# Patient Record
Sex: Female | Born: 1975 | Race: Black or African American | Hispanic: No | Marital: Single | Smoking: Former smoker
Health system: Southern US, Community
[De-identification: ages and names within clinical notes are randomized; demographics above are authoritative.]

## PROBLEM LIST (undated history)

## (undated) DIAGNOSIS — M25461 Effusion, right knee: Secondary | ICD-10-CM

## (undated) DIAGNOSIS — S83289A Other tear of lateral meniscus, current injury, unspecified knee, initial encounter: Secondary | ICD-10-CM

## (undated) DIAGNOSIS — N809 Endometriosis, unspecified: Secondary | ICD-10-CM

## (undated) HISTORY — PX: WISDOM TOOTH EXTRACTION: SHX21

## (undated) HISTORY — DX: Endometriosis, unspecified: N80.9

---

## 1999-02-11 ENCOUNTER — Emergency Department (HOSPITAL_COMMUNITY): Admission: EM | Admit: 1999-02-11 | Discharge: 1999-02-11 | Payer: Self-pay | Admitting: Emergency Medicine

## 2011-08-29 ENCOUNTER — Ambulatory Visit (INDEPENDENT_AMBULATORY_CARE_PROVIDER_SITE_OTHER): Payer: Managed Care, Other (non HMO) | Admitting: Family Medicine

## 2011-08-29 ENCOUNTER — Ambulatory Visit: Payer: Managed Care, Other (non HMO)

## 2011-08-29 VITALS — BP 116/82 | HR 68 | Temp 98.4°F | Resp 16 | Ht 63.0 in | Wt 223.0 lb

## 2011-08-29 DIAGNOSIS — M25561 Pain in right knee: Secondary | ICD-10-CM

## 2011-08-29 DIAGNOSIS — Z720 Tobacco use: Secondary | ICD-10-CM

## 2011-08-29 DIAGNOSIS — E669 Obesity, unspecified: Secondary | ICD-10-CM | POA: Insufficient documentation

## 2011-08-29 DIAGNOSIS — M25569 Pain in unspecified knee: Secondary | ICD-10-CM

## 2011-08-29 MED ORDER — MELOXICAM 7.5 MG PO TABS: 7.5000 mg | ORAL_TABLET | Freq: Every day | ORAL | Status: AC

## 2011-08-29 NOTE — Patient Instructions (Signed)
Knee Sprain  You have a knee sprain. Sprains are painful injuries to the joints. A sprain is a partial or complete tearing of ligaments. Ligaments are tough, fibrous tissues that hold bones together at the joints. A strain (sprain) has occurred when a ligament is stretched or damaged. This injury may take several weeks to heal. This is often the same length of time as a bone fracture (break in bone) takes to heal. Even though a fracture (bone break) may not have occurred, the recovery times may be similar.  HOME CARE INSTRUCTIONS   · Rest the injured area for as long as directed by your caregiver. Then slowly start using the joint as directed by your caregiver and as the pain allows. Use crutches as directed. If the knee was splinted or casted, continue use and care as directed. If an ace bandage has been applied today, it should be removed and reapplied every 3 to 4 hours. It should not be applied tightly, but firmly enough to keep swelling down. Watch toes and feet for swelling, bluish discoloration, coldness, numbness or excessive pain. If any of these symptoms occur, remove the ace bandage and reapply more loosely.If these symptoms persist, seek medical attention.  · For the first 24 hours, lie down. Keep the injured extremity elevated on two pillows.  · Apply ice to the injured area for 15 to 20 minutes every couple hours. Repeat this 3 to 4 times per day for the first 48 hours. Put the ice in a plastic bag and place a towel between the bag of ice and your skin.  · Wear any splinting, casting, or elastic bandage applications as instructed.  · Only take over-the-counter or prescription medicines for pain, discomfort, or fever as directed by your caregiver. Do not use aspirin immediately after the injury unless instructed by your caregiver. Aspirin can cause increased bleeding and bruising of the tissues.  · If you were given crutches, continue to use them as instructed. Do not resume weight bearing on the  affected extremity until instructed.  Persistent pain and inability to use the injured area as directed for more than 2 to 3 days are warning signs. If this happens you should see a caregiver for a follow-up visit as soon as possible. Initially, a hairline fracture (this is the same as a broken bone) may not be evident on x-rays. Persistent pain and swelling indicate that further evaluation, non-weight bearing (use of crutches as instructed), and/or further x-rays are indicated. X-rays may sometimes not show a small fracture until a week or ten days later. Make a follow-up appointment with your own caregiver or one to whom we have referred you. A radiologist (specialist in reading x-rays) may re-read your X-rays. Make sure you know how you are to get your x-ray results. Do not assume everything is normal if you do not hear from us.  SEEK MEDICAL CARE IF:   · Bruising, swelling, or pain increases.  · You have cold or numb toes  · You have continuing difficulty or pain with walking.  SEEK IMMEDIATE MEDICAL CARE IF:   · Your toes are cold, numb or blue.  · The pain is not responding to medications and continues to stay the same or get worse.  MAKE SURE YOU:   · Understand these instructions.  · Will watch your condition.  · Will get help right away if you are not doing well or get worse.  Document Released: 05/05/2005 Document Revised: 04/24/2011 Document Reviewed: 04/19/2007    Exit[employer]® Patient Information ©2012 [employer].

## 2011-08-29 NOTE — Progress Notes (Signed)
  Subjective:    Patient ID: Sarah Ayers, female    DOB: 02/14/1976, 36 y.o.   MRN: [id number]  HPI  Sarah Ayers is a 36 year old AA female who has no PCP in today because of right knee pain.  She did a lot of walking on flat surfaces last week and noticed it swelled and became tender.  She has been taking ibuprophen without relief.  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  She denies any previous injuries or recent falls.  She is sexually active with men but uses condoms and denies pregnancy she is on her menses now.  No history of hormone use, CP, SOB.    Review of Systems  Constitutional: Negative.   HENT: Negative.   Eyes: Negative.   Respiratory: Negative.   Cardiovascular: Negative.   Gastrointestinal: Negative.   Genitourinary: Negative.   Musculoskeletal: Positive for joint swelling and gait problem. Negative for myalgias and back pain.  Skin: Negative.   Neurological: Negative.   Hematological: Negative.   Psychiatric/Behavioral: Negative.   All other systems reviewed and are negative.       Objective:   Physical Exam  Vitals reviewed. Constitutional: She is oriented to person, place, and time. She appears well-developed and well-nourished.       obese  HENT:  Head: Normocephalic and atraumatic.  Right Ear: External ear normal.  Cardiovascular: Normal rate, regular rhythm and normal heart sounds.   Pulmonary/Chest: Effort normal and breath sounds normal.  Neurological: She is alert and oriented to person, place, and time.  Skin: Skin is warm and dry.  Psychiatric: She has a normal mood and affect. Her behavior is normal.  right knee without instability (negative Lachman's), no catching noted with passive ROM, tender over medial and lateral meniscus and posterior knee (no Baker's cyst noted).  Diffuse swelling over pes anserine bursa.     [facility] reading (PRIMARY) by  Dr. Hal Hope, no degenerative changes noted in knee.  Soft tissue  swelling/bursitis noted.         Assessment & Plan:  Knee Sprain/Synovitis:  Mobic 7.5 mg BID #30 given.  Rest, ice, elevate.  OOW note for Kolh's job for one week to rest knee.  Pt may purchase knee support device as desired at [facility].  Will refer to ortho if pain persists beyond 2 weeks, pt agrees.

## 2011-09-02 ENCOUNTER — Telehealth: Payer: Self-pay | Admitting: Radiology

## 2011-09-02 NOTE — Telephone Encounter (Signed)
Message copied by Luretha Murphy on Tue Sep 02, 2011  3:33 PM ------      Message from: Sarah Ayers E      Created: Mon Sep 01, 2011  2:53 PM       Please tell pt the radiologist agreed she had no arthritis or any bone changes to her knee.  Let us know if she is not gradually improving with rest and her Mobic.

## 2011-09-02 NOTE — Telephone Encounter (Signed)
LMOM OF MESSAGE AND TO LET us KNOW IF SHE ISN'T GETTING ANY BETTER.

## 2011-09-02 NOTE — Progress Notes (Deleted)
  Subjective:    Patient ID: Sarah Ayers, female    DOB: 09/09/1975, 36 y.o.   MRN: [id number]  HPI    Review of Systems     Objective:   Physical Exam        Assessment & Plan:   1. Nicotine abuse    2. Obesity,     3. Pain, joint, knee, right  DG Knee Complete 4 Views Right

## 2011-09-03 NOTE — Telephone Encounter (Signed)
Pt is requesting a copy of her x-ray of her knee to take to her appt that is tomorrow 09-04-11 @ 3pm please contact when ready for pick-up, pt was informed that they have to allow 24-48hrs for request to be done.

## 2011-09-04 NOTE — Telephone Encounter (Signed)
LMOM to cb

## 2011-09-29 ENCOUNTER — Encounter (HOSPITAL_BASED_OUTPATIENT_CLINIC_OR_DEPARTMENT_OTHER): Payer: Self-pay | Admitting: *Deleted

## 2011-09-29 NOTE — Progress Notes (Addendum)
NPO AFTER MN. ARRIVES AT 1030. NEEDS HG AND URINE PREG. MAY TAKE HYDROCODONE AM OF SURG. W/ SIP OF WATER. PT W/ RATE 10 RIGHT KNEE PAIN, PT ADVISED TO KEEP ELEVATED, USE ICE PACKS AND REST .

## 2011-09-30 ENCOUNTER — Other Ambulatory Visit: Payer: Self-pay | Admitting: Orthopedic Surgery

## 2011-10-03 ENCOUNTER — Encounter (HOSPITAL_BASED_OUTPATIENT_CLINIC_OR_DEPARTMENT_OTHER)
Admission: RE | Disposition: A | Discharge status: Home / Self Care | Payer: Self-pay | Source: Ambulatory Visit | Attending: Orthopedic Surgery

## 2011-10-03 ENCOUNTER — Ambulatory Visit (HOSPITAL_BASED_OUTPATIENT_CLINIC_OR_DEPARTMENT_OTHER): Payer: Managed Care, Other (non HMO) | Admitting: Anesthesiology

## 2011-10-03 ENCOUNTER — Encounter (HOSPITAL_BASED_OUTPATIENT_CLINIC_OR_DEPARTMENT_OTHER): Payer: Self-pay | Admitting: *Deleted

## 2011-10-03 ENCOUNTER — Ambulatory Visit (HOSPITAL_BASED_OUTPATIENT_CLINIC_OR_DEPARTMENT_OTHER)
Admission: RE | Admit: 2011-10-03 | Discharge: 2011-10-03 | Disposition: A | Discharge status: Home / Self Care | Payer: Managed Care, Other (non HMO) | Source: Ambulatory Visit | Attending: Orthopedic Surgery | Admitting: Orthopedic Surgery

## 2011-10-03 ENCOUNTER — Encounter (HOSPITAL_BASED_OUTPATIENT_CLINIC_OR_DEPARTMENT_OTHER): Payer: Self-pay | Admitting: Anesthesiology

## 2011-10-03 DIAGNOSIS — S83289A Other tear of lateral meniscus, current injury, unspecified knee, initial encounter: Secondary | ICD-10-CM | POA: Insufficient documentation

## 2011-10-03 DIAGNOSIS — Z9889 Other specified postprocedural states: Secondary | ICD-10-CM

## 2011-10-03 DIAGNOSIS — X58XXXA Exposure to other specified factors, initial encounter: Secondary | ICD-10-CM | POA: Insufficient documentation

## 2011-10-03 HISTORY — PX: MENISECTOMY: SHX5181

## 2011-10-03 HISTORY — DX: Other tear of lateral meniscus, current injury, unspecified knee, initial encounter: S83.289A

## 2011-10-03 HISTORY — DX: Effusion, right knee: M25.461

## 2011-10-03 LAB — POCT HEMOGLOBIN-HEMACUE: Hemoglobin: 12.3 g/dL (ref 12.0–15.0)

## 2011-10-03 LAB — POCT PREGNANCY, URINE: Preg Test, Ur: NEGATIVE

## 2011-10-03 SURGERY — CHONDRECTOMY, KNEE, SEMILUNAR CARTILAGE
Anesthesia: General | Site: Knee | Laterality: Right | Wound class: Clean

## 2011-10-03 MED ORDER — POVIDONE-IODINE 7.5 % EX SOLN
Freq: Once | CUTANEOUS | Status: AC
  Administered 2011-10-03: 11:00:00 via TOPICAL

## 2011-10-03 MED ORDER — OXYCODONE HCL 5 MG PO TABS
2.5000 mg | ORAL_TABLET | ORAL | Status: DC | PRN
  Administered 2011-10-03: 2.5 mg via ORAL

## 2011-10-03 MED ORDER — LACTATED RINGERS IV SOLN
INTRAVENOUS | Status: DC
  Administered 2011-10-03 (×2): via INTRAVENOUS

## 2011-10-03 MED ORDER — LIDOCAINE HCL (CARDIAC) 20 MG/ML IV SOLN
INTRAVENOUS | Status: DC | PRN
  Administered 2011-10-03: 50 mg via INTRAVENOUS

## 2011-10-03 MED ORDER — MIDAZOLAM HCL 5 MG/5ML IJ SOLN
INTRAMUSCULAR | Status: DC | PRN
  Administered 2011-10-03: 2 mg via INTRAVENOUS

## 2011-10-03 MED ORDER — SODIUM CHLORIDE 0.9 % IR SOLN
Status: DC | PRN
  Administered 2011-10-03: 13:00:00

## 2011-10-03 MED ORDER — FENTANYL CITRATE 0.05 MG/ML IJ SOLN: 25.0000 ug | INTRAMUSCULAR | Status: DC | PRN

## 2011-10-03 MED ORDER — BUPIVACAINE-EPINEPHRINE 0.5% -1:200000 IJ SOLN
INTRAMUSCULAR | Status: DC | PRN
  Administered 2011-10-03: 20 mL

## 2011-10-03 MED ORDER — MORPHINE SULFATE 4 MG/ML IJ SOLN
INTRAMUSCULAR | Status: DC | PRN
  Administered 2011-10-03: 4 mg via INTRAVENOUS

## 2011-10-03 MED ORDER — FENTANYL CITRATE 0.05 MG/ML IJ SOLN
INTRAMUSCULAR | Status: DC | PRN
  Administered 2011-10-03 (×6): 50 ug via INTRAVENOUS

## 2011-10-03 MED ORDER — PROPOFOL 10 MG/ML IV EMUL
INTRAVENOUS | Status: DC | PRN
  Administered 2011-10-03: 200 mg via INTRAVENOUS
  Administered 2011-10-03: 100 mg via INTRAVENOUS

## 2011-10-03 MED ORDER — ONDANSETRON HCL 4 MG/2ML IJ SOLN
INTRAMUSCULAR | Status: DC | PRN
  Administered 2011-10-03: 4 mg via INTRAVENOUS

## 2011-10-03 MED ORDER — OXYCODONE-ACETAMINOPHEN 7.5-325 MG PO TABS: 1.0000 | ORAL_TABLET | ORAL | Status: AC | PRN

## 2011-10-03 MED ORDER — DEXAMETHASONE SODIUM PHOSPHATE 4 MG/ML IJ SOLN
INTRAMUSCULAR | Status: DC | PRN
  Administered 2011-10-03: 10 mg via INTRAVENOUS

## 2011-10-03 MED ORDER — METHOCARBAMOL 500 MG PO TABS: 500.0000 mg | ORAL_TABLET | Freq: Four times a day (QID) | ORAL | Status: AC

## 2011-10-03 MED ORDER — KETOROLAC TROMETHAMINE 30 MG/ML IJ SOLN
INTRAMUSCULAR | Status: DC | PRN
  Administered 2011-10-03: 30 mg via INTRAVENOUS

## 2011-10-03 MED ORDER — OXYCODONE-ACETAMINOPHEN 5-325 MG PO TABS
1.0000 | ORAL_TABLET | ORAL | Status: DC | PRN
  Administered 2011-10-03: 1 via ORAL

## 2011-10-03 MED ORDER — KETOROLAC TROMETHAMINE 30 MG/ML IJ SOLN: 15.0000 mg | Freq: Once | INTRAMUSCULAR | Status: DC | PRN

## 2011-10-03 MED ORDER — PROMETHAZINE HCL 25 MG/ML IJ SOLN: 6.2500 mg | INTRAMUSCULAR | Status: DC | PRN

## 2011-10-03 SURGICAL SUPPLY — 62 items
BANDAGE ELASTIC 6 VELCRO ST LF (GAUZE/BANDAGES/DRESSINGS) ×6 IMPLANT
BANDAGE ESMARK 6X9 LF (GAUZE/BANDAGES/DRESSINGS) ×3 IMPLANT
BANDAGE GAUZE ELAST BULKY 4 IN (GAUZE/BANDAGES/DRESSINGS) ×4 IMPLANT
BLADE 4.2CUDA (BLADE) IMPLANT
BLADE CUDA 5.5 (BLADE) IMPLANT
BLADE CUDA SHAVER 3.5 (BLADE) ×4 IMPLANT
BLADE CUTTER GATOR 3.5 (BLADE) IMPLANT
BLADE GREAT WHITE 4.2 (BLADE) IMPLANT
BNDG CMPR 9X6 STRL LF SNTH (GAUZE/BANDAGES/DRESSINGS) ×3
BNDG ESMARK 6X9 LF (GAUZE/BANDAGES/DRESSINGS) ×4
CANISTER SUCT LVC 12 LTR MEDI- (MISCELLANEOUS) ×6 IMPLANT
CANISTER SUCTION 1200CC (MISCELLANEOUS) ×4 IMPLANT
CLOTH BEACON ORANGE TIMEOUT ST (SAFETY) ×4 IMPLANT
DRAPE ARTHROSCOPY W/POUCH 114 (DRAPES) ×4 IMPLANT
DRAPE LG THREE QUARTER DISP (DRAPES) ×4 IMPLANT
DRSG EMULSION OIL 3X3 NADH (GAUZE/BANDAGES/DRESSINGS) ×4 IMPLANT
DRSG PAD ABDOMINAL 8X10 ST (GAUZE/BANDAGES/DRESSINGS) ×2 IMPLANT
DURAPREP 26ML APPLICATOR (WOUND CARE) ×4 IMPLANT
ELECT MENISCUS 165MM 90D (ELECTRODE) IMPLANT
ELECT REM PT RETURN 9FT ADLT (ELECTROSURGICAL)
ELECTRODE REM PT RTRN 9FT ADLT (ELECTROSURGICAL) IMPLANT
GLOVE BIOGEL PI IND STRL 6.5 (GLOVE) ×1 IMPLANT
GLOVE BIOGEL PI IND STRL 7.0 (GLOVE) ×1 IMPLANT
GLOVE BIOGEL PI IND STRL 8 (GLOVE) ×3 IMPLANT
GLOVE BIOGEL PI INDICATOR 6.5 (GLOVE) ×1
GLOVE BIOGEL PI INDICATOR 7.0 (GLOVE) ×1
GLOVE BIOGEL PI INDICATOR 8 (GLOVE) ×1
GLOVE ECLIPSE 6.0 STRL STRAW (GLOVE) ×2 IMPLANT
GLOVE ECLIPSE 7.5 STRL STRAW (GLOVE) ×2 IMPLANT
GLOVE ECLIPSE 8.0 STRL XLNG CF (GLOVE) ×8 IMPLANT
GLOVE INDICATOR 8.0 STRL GRN (GLOVE) ×8 IMPLANT
GLOVE LITE  25/BX (GLOVE) ×2 IMPLANT
GOWN PREVENTION PLUS LG XLONG (DISPOSABLE) ×4 IMPLANT
GOWN STRL REIN XL XLG (GOWN DISPOSABLE) ×4 IMPLANT
GOWN SURGICAL LARGE (GOWNS) ×2 IMPLANT
GOWN SURGICAL XLG (GOWNS) ×4 IMPLANT
IV NS IRRIG 3000ML ARTHROMATIC (IV SOLUTION) ×8 IMPLANT
KNEE WRAP E Z 3 GEL PACK (MISCELLANEOUS) ×4 IMPLANT
LEGGING LITHOTOMY PAIR STRL (DRAPES) ×2 IMPLANT
NDL HYPO 18GX1.5 BLUNT FILL (NEEDLE) ×2 IMPLANT
NDL SAFETY ECLIPSE 18X1.5 (NEEDLE) ×3 IMPLANT
NEEDLE HYPO 18GX1.5 BLUNT FILL (NEEDLE) ×4 IMPLANT
NEEDLE HYPO 18GX1.5 SHARP (NEEDLE) ×4
PACK ARTHROSCOPY DSU (CUSTOM PROCEDURE TRAY) ×4 IMPLANT
PACK BASIN DAY SURGERY FS (CUSTOM PROCEDURE TRAY) ×4 IMPLANT
PADDING CAST ABS 4INX4YD NS (CAST SUPPLIES) ×1
PADDING CAST ABS 6INX4YD NS (CAST SUPPLIES) ×1
PADDING CAST ABS COTTON 4X4 ST (CAST SUPPLIES) ×3 IMPLANT
PADDING CAST ABS COTTON 6X4 NS (CAST SUPPLIES) ×1 IMPLANT
PENCIL BUTTON HOLSTER BLD 10FT (ELECTRODE) IMPLANT
SET ARTHROSCOPY TUBING (MISCELLANEOUS) ×4
SET ARTHROSCOPY TUBING LN (MISCELLANEOUS) ×3 IMPLANT
SPONGE GAUZE 4X4 12PLY (GAUZE/BANDAGES/DRESSINGS) ×4 IMPLANT
SUT ETHIBOND 2 OS 4 DA (SUTURE) IMPLANT
SUT ETHILON 4 0 PS 2 18 (SUTURE) ×4 IMPLANT
SUT VIC AB 0 CT1 36 (SUTURE) IMPLANT
SUT VIC AB 2-0 PS2 27 (SUTURE) IMPLANT
SYRINGE 10CC LL (SYRINGE) ×4 IMPLANT
TOWEL NATURAL 6PK STERILE (DISPOSABLE) ×2 IMPLANT
TOWEL OR 17X24 6PK STRL BLUE (TOWEL DISPOSABLE) ×4 IMPLANT
WAND 90 DEG TURBOVAC W/CORD (SURGICAL WAND) IMPLANT
WATER STERILE IRR 500ML POUR (IV SOLUTION) ×4 IMPLANT

## 2011-10-03 NOTE — Anesthesia Postprocedure Evaluation (Signed)
  Anesthesia Post-op Note  Patient: Sarah Ayers  Procedure(s) Performed: Procedure(s) (LRB): MENISECTOMY (Right) KNEE ARTHROSCOPY WITH MEDIAL MENISECTOMY ()  Patient Location: PACU  Anesthesia Type: General  Level of Consciousness: awake and alert   Airway and Oxygen Therapy: Patient Spontanous Breathing  Post-op Pain: mild  Post-op Assessment: Post-op Vital signs reviewed, Patient's Cardiovascular Status Stable, Respiratory Function Stable, Patent Airway and No signs of Nausea or vomiting  Post-op Vital Signs: stable  Complications: No apparent anesthesia complications

## 2011-10-03 NOTE — Anesthesia Preprocedure Evaluation (Signed)
Anesthesia Evaluation  Patient identified by MRN, date of birth, ID band Patient awake    Reviewed: Allergy & Precautions, H&P , NPO status , Patient's Chart, lab work & pertinent test results  Airway Mallampati: II TM Distance: >3 FB Neck ROM: Full    Dental No notable dental hx.    Pulmonary Current Smoker,  breath sounds clear to auscultation  Pulmonary exam normal       Cardiovascular negative cardio ROS  Rhythm:Regular Rate:Normal     Neuro/Psych negative neurological ROS  negative psych ROS   GI/Hepatic negative GI ROS, Neg liver ROS,   Endo/Other  Morbid obesity  Renal/GU negative Renal ROS  negative genitourinary   Musculoskeletal negative musculoskeletal ROS (+)   Abdominal   Peds negative pediatric ROS (+)  Hematology negative hematology ROS (+)   Anesthesia Other Findings   Reproductive/Obstetrics negative OB ROS                           Anesthesia Physical Anesthesia Plan  ASA: II  Anesthesia Plan: General   Post-op Pain Management:    Induction: Intravenous  Airway Management Planned: LMA  Additional Equipment:   Intra-op Plan:   Post-operative Plan:   Informed Consent: I have reviewed the patients History and Physical, chart, labs and discussed the procedure including the risks, benefits and alternatives for the proposed anesthesia with the patient or authorized representative who has indicated his/her understanding and acceptance.   Dental advisory given  Plan Discussed with: CRNA  Anesthesia Plan Comments:         Anesthesia Quick Evaluation

## 2011-10-03 NOTE — H&P (Signed)
Sarah Ayers is an 36 y.o. female.   Chief Complaint: painful rt knee  HPI: MRI demonstrates torn lateral meniscus  Past Medical History  Diagnosis Date  . Acute meniscal tear, lateral RIGHT  . Knee swelling, right     Past Surgical History  Procedure Date  . Wisdom tooth extraction AGE 9    [facility]    History reviewed. No pertinent family history. Social History:  reports that she has been smoking.  She has never used smokeless tobacco. She reports that she drinks alcohol. She reports that she does not use illicit drugs.  Allergies: No Known Allergies  Medications Prior to Admission  Medication Sig Dispense Refill  . HYDROcodone-acetaminophen (NORCO) 5-325 MG per tablet Take 1 tablet by mouth every 6 (six) hours as needed.      . meloxicam (MOBIC) 7.5 MG tablet Take 1 tablet (7.5 mg total) by mouth daily.  30 tablet  0    Results for orders placed during the hospital encounter of 10/03/11 (from the past 48 hour(s))  POCT HEMOGLOBIN-HEMACUE     Status: Normal   Collection Time   10/03/11 11:36 AM      Component Value Range Comment   Hemoglobin 12.3  12.0 - 15.0 (g/dL)    No results found.  ROS  Height 5\' 3"  (1.6 m), weight 99.791 kg (220 lb), last menstrual period 09/24/2011. Physical Exam  Constitutional: She is oriented to person, place, and time. She appears well-developed and well-nourished.  HENT:  Head: Normocephalic and atraumatic.  Right Ear: External ear normal.  Left Ear: External ear normal.  Nose: Nose normal.  Mouth/Throat: Oropharynx is clear and moist.  Eyes: Conjunctivae and EOM are normal. Pupils are equal, round, and reactive to light.  Neck: Normal range of motion. Neck supple.  Cardiovascular: Normal rate, regular rhythm, normal heart sounds and intact distal pulses.   GI: Soft. Bowel sounds are normal.  Musculoskeletal: Normal range of motion. She exhibits tenderness.       tender lateral joint line rt knee  Neurological: She is  alert and oriented to person, place, and time. She has normal reflexes.  Skin: Skin is warm and dry.  Psychiatric: She has a normal mood and affect. Her behavior is normal. Judgment and thought content normal.     Assessment/Plan Torn lateral meniscus rt knee Rt knee arthroscopy with partial lateral meniscectomy  Teresha Hanks P 10/03/2011, 12:37 PM

## 2011-10-03 NOTE — Anesthesia Procedure Notes (Signed)
Procedure Name: LMA Insertion Date/Time: 10/03/2011 12:47 PM Performed by: Maris Berger T Pre-anesthesia Checklist: Patient identified, Emergency Drugs available, Suction available and Patient being monitored Patient Re-evaluated:Patient Re-evaluated prior to inductionOxygen Delivery Method: Circle System Utilized Preoxygenation: Pre-oxygenation with 100% oxygen Intubation Type: IV induction Ventilation: Mask ventilation without difficulty LMA: LMA inserted LMA Size: 5.0 Number of attempts: 1 Airway Equipment and Method: bite block Placement Confirmation: positive ETCO2 Dental Injury: Teeth and Oropharynx as per pre-operative assessment

## 2011-10-03 NOTE — Transfer of Care (Signed)
Immediate Anesthesia Transfer of Care Note  Patient: Sarah Ayers  Procedure(s) Performed: Procedure(s) (LRB): MENISECTOMY (Right) KNEE ARTHROSCOPY WITH MEDIAL MENISECTOMY ()  Patient Location: PACU  Anesthesia Type: General  Level of Consciousness: awake, alert  and oriented  Airway & Oxygen Therapy: Patient Spontanous Breathing and Patient connected to nasal cannula oxygen  Post-op Assessment: Report given to PACU RN  Post vital signs: Reviewed and stable  Complications: No apparent anesthesia complications

## 2011-10-03 NOTE — Brief Op Note (Signed)
10/03/2011  2:09 PM  PATIENT:  Sarah Ayers  36 y.o. female  PRE-OPERATIVE DIAGNOSIS:  TORN LATERAL MENISCUS RIGHT KNEE  POST-OPERATIVE DIAGNOSIS:  TORN LATERAL MENISCUS and medial meniscus  Right knee  PROCEDURE:  Procedure(s) (LRB): MENISECTOMY (Right) KNEE ARTHROSCOPY WITH partial medial and lateral meniscectomies  SURGEON:  Surgeon(s) and Role:    * Drucilla Schmidt, MD - Primary  PHYSICIAN ASSISTANT:   ASSISTANTS: nurse   ANESTHESIA:   general  EBL:  Total I/O In: 1150 [I.V.:1150] Out: -   BLOOD ADMINISTERED:none  DRAINS: none   LOCAL MEDICATIONS USED:  MARCAINE     SPECIMEN:  No Specimen  DISPOSITION OF SPECIMEN:  N/A  COUNTS:  YES  TOURNIQUET:   Total Tourniquet Time Documented: Thigh (Right) - 52 minutes  DICTATION: .Other Dictation: Dictation Number [id number]  PLAN OF CARE: Discharge to home after PACU  PATIENT DISPOSITION:  PACU - hemodynamically stable.   Delay start of Pharmacological VTE agent (>24hrs) due to surgical blood loss or risk of bleeding: not applicable

## 2011-10-03 NOTE — Discharge Instructions (Signed)
Post Anesthesia Home Care Instructions  Activity: Get plenty of rest for the remainder of the day. A responsible adult should stay with you for 24 hours following the procedure.  For the next 24 hours, DO NOT: -Drive a car -Advertising copywriter -Drink alcoholic beverages -Take any medication unless instructed by your physician -Make any legal decisions or sign important papers.  Meals: Start with liquid foods such as gelatin or soup. Progress to regular foods as tolerated. Avoid greasy, spicy, heavy foods. If nausea and/or vomiting occur, drink only clear liquids until the nausea and/or vomiting subsides. Call your physician if vomiting continues.  Special Instructions/Symptoms: Your throat may feel dry or sore from the anesthesia or the breathing tube placed in your throat during surgery. If this causes discomfort, gargle with warm salt water. The discomfort should disappear within 24 hours.    ARTHROSCOPIC KNEE SURGERY HOME CARE INSTRUCTIONS PAIN You will be expected to have a moderate amount of pain in the affected knee for approximately two weeks.  However, the first two to four days will be the most severe in terms of the pain you will experience.  Prescriptions have been provided for you to take as needed for the pain.  The pain can be markedly reduced by using the ice/compressive bandage given.  Exchange the ice packs whenever they thaw.  During the night, keep the bandage on because it will still provide some compression for the swelling.  Also, keep the leg elevated on pillows above your heart, and this will help alleviate the pain and swelling.  ACTIVITY It is preferred that you stay on bedrest for approximately 24 hours.  However, you may go to the bathroom with help.  After this, you can start to be up and about progressively more.  Remember that the swelling may still increase after three to four days if you are up and doing too much.  You may put as much weight on the affected leg  as pain will allow.  Use your crutches for comfort and safety.  However, as soon as you are able, you may discard the crutches and go without them.   DRESSING Keep the current dressing as dry as possible.  Two days after your surgery, you may remove the ice/compressive wrap, and surgical dressing.  You may now take a shower, but do not scrub the sounds directly with soap.  Let water rinse over these and gently wipe with your hand.  Reapply band-aids over the puncture wounds and more gauze if needed.  A slight amount of thin drainage can be normal at this time, and do not let it frighten you.  Reapply the ice/compressive wrap.  You may now repeat this every day each time you shower.  SYMPTOMS TO REPORT TO YOUR DOCTOR  -Extreme pain.  -Extreme swelling.  -Temperature above 101 degrees that does not come down with acetaminophen     (Tylenol).  -Any changes in the feeling, color or movement of your toes.  -Extreme redness, heat, swelling or drainage at your incision  EXERCISE It is preferred that you begin to exercise on the day of your surgery.  Straight leg raises and short arc quads should be begun the afternoon or evening of surgery and continued until you come back for your follow-up appointment.   Attached is an instruction sheet on how to perform these two simple exercises.  Do these at least three times per day if not more.  You may bend your knee as much as  is comfortable.  The puncture wounds may occasionally be slightly uncomfortable with bending of the knee.  Do not let this frighten you.  It is important to keep your knee motion, but do not overdo it.  If you have significant pain, simply do not bend the knee as far.   You will be given more exercises to perform at your first return visit.    RETURN APPOINTMENT Please make an appointment to be seen by Dr. Simonne Come on 5/20 in the AM.    Call to make an appointment.  Patient Signature:   ________________________________________________________  Nurse's Signature:  ________________________________________________________

## 2011-10-06 ENCOUNTER — Encounter (HOSPITAL_BASED_OUTPATIENT_CLINIC_OR_DEPARTMENT_OTHER): Payer: Self-pay | Admitting: Orthopedic Surgery

## 2011-10-06 NOTE — Op Note (Signed)
NAMEORPHIA, MCTIGUE NO.:  [id number]  MEDICAL RECORD NO.:  [id number]  LOCATION:                               FACILITY:  [facility]  PHYSICIAN:  Marlowe Kays, M.D.  DATE OF BIRTH:  Aug 08, 1975  DATE OF PROCEDURE:  10/03/2011 DATE OF DISCHARGE:                              OPERATIVE REPORT   PREOPERATIVE DIAGNOSIS:  Torn lateral meniscus, right knee.  POSTOPERATIVE DIAGNOSIS:  Torn medial and lateral menisci, right knee.  OPERATION:  Right knee arthroscopy with partial medial and lateral meniscectomies.  SURGEON:  Marlowe Kays, M.D.  ASSISTANT:  Nurse.  ANESTHESIA:  General.  PATHOLOGY AND JUSTIFICATION FOR PROCEDURE:  Because of painful right knee, an MRI was obtained and this demonstrated tearing of the anterior third of the lateral meniscus with some mild patellofemoral arthritis. At surgery, I also found this entity, but also found a small flap tear of the anterior third of the medial meniscus, which I debrided out as well.  DESCRIPTION OF PROCEDURE:  Satisfied general anesthesia, Ace wrap, and knee support to left lower extremity.  Pneumatic tourniquet applied to right lower extremity with the leg Esmarched out nonsterilely, and initially, inflated to 350 mmHg, but subsequently to 375 with some back flow bleeding during the case.  Thigh stabilizer was applied.  Time-out performed.  The right leg was then prepped with DuraPrep from stabilizer to ankle, draped in sterile field, superior medial saline inflow. First, an anterolateral portal of the medial compartment knee joint was evaluated and she had some very minimal wear of the medial femoral condyle with the flap tear of the anterior third of the medial meniscus, which I pictured and resected with the 3.5 shaver.  Looking at the medial gutter and suprapatellar area, she had some wear in the trochlear notch which did not require shaving.  The patella surface looked unremarkable.  I then reversed  portals.  ACL was intact.  She had some lamination type tearing of the anterior third of the lateral meniscus with the remainder of the meniscus looking normal on visualization and probing.  I managed the laminated tear by cutting the torn anterior surface with arthroscopic scissors, then shaving down the meniscus until smooth with a 3.5 shaver.  Final pictures were taken.  The knee joint was then irrigated to clear and all fluid possibly removed.  I closed the two entry portals with 4-0 nylon and then injected 20 cc of 0.5% Marcaine with adrenaline, 4 mg of morphine through the inflow apparatus, which I removed and then closed with 4-0 nylon as well.  Betadine, Adaptic, dry sterile dressing were applied.  Tourniquet was released. She tolerated the procedure well, was taken to recovery room in satisfactory condition with no known complications.          ______________________________ Marlowe Kays, M.D.     JA/MEDQ  D:  10/03/2011  T:  10/04/2011  Job:  [id number]

## 2016-09-02 DIAGNOSIS — J00 Acute nasopharyngitis [common cold]: Secondary | ICD-10-CM | POA: Diagnosis not present

## 2016-12-23 DIAGNOSIS — F17200 Nicotine dependence, unspecified, uncomplicated: Secondary | ICD-10-CM | POA: Diagnosis not present

## 2016-12-23 DIAGNOSIS — J189 Pneumonia, unspecified organism: Secondary | ICD-10-CM | POA: Diagnosis not present

## 2016-12-29 ENCOUNTER — Emergency Department (HOSPITAL_COMMUNITY)
Admission: EM | Admit: 2016-12-29 | Discharge: 2016-12-29 | Disposition: A | Discharge status: Home / Self Care | Payer: BLUE CROSS/BLUE SHIELD | Attending: Emergency Medicine | Admitting: Emergency Medicine

## 2016-12-29 ENCOUNTER — Encounter (HOSPITAL_COMMUNITY): Payer: Self-pay | Admitting: Emergency Medicine

## 2016-12-29 ENCOUNTER — Emergency Department (HOSPITAL_COMMUNITY): Payer: BLUE CROSS/BLUE SHIELD

## 2016-12-29 DIAGNOSIS — R05 Cough: Secondary | ICD-10-CM | POA: Diagnosis not present

## 2016-12-29 DIAGNOSIS — F17200 Nicotine dependence, unspecified, uncomplicated: Secondary | ICD-10-CM | POA: Diagnosis not present

## 2016-12-29 DIAGNOSIS — F17210 Nicotine dependence, cigarettes, uncomplicated: Secondary | ICD-10-CM | POA: Diagnosis not present

## 2016-12-29 DIAGNOSIS — J069 Acute upper respiratory infection, unspecified: Secondary | ICD-10-CM | POA: Insufficient documentation

## 2016-12-29 DIAGNOSIS — R079 Chest pain, unspecified: Secondary | ICD-10-CM | POA: Diagnosis not present

## 2016-12-29 DIAGNOSIS — R059 Cough, unspecified: Secondary | ICD-10-CM

## 2016-12-29 MED ORDER — BENZONATATE 100 MG PO CAPS: 100.0000 mg | ORAL_CAPSULE | Freq: Three times a day (TID) | ORAL | 0 refills | Status: DC | PRN

## 2016-12-29 MED ORDER — KETOROLAC TROMETHAMINE 60 MG/2ML IM SOLN
30.0000 mg | Freq: Once | INTRAMUSCULAR | Status: AC
  Administered 2016-12-29: 30 mg via INTRAMUSCULAR
  Filled 2016-12-29: qty 2

## 2016-12-29 NOTE — Discharge Instructions (Signed)
It was my pleasure taking care of you today!   Your symptoms are likely due to a viral upper respiratory infection. Fortunately, we did not see evidence of serious infection and can treat your symptoms. Flonase and mucinex for nasal congestion as needed, tessalon as needed for cough. Continue inhaler. Alternate between Tylenol and ibuprofen as needed for body aches / fevers.   Rest, drink plenty of fluids to be sure you are staying hydrated.   Please follow up with your primary doctor for discussion of your diagnoses and further evaluation after today's visit if symptoms persist longer than 5-7 more days; Return to the ER for high fevers, difficulty breathing or other concerning symptoms

## 2016-12-29 NOTE — ED Triage Notes (Signed)
Pt sts cough and URI sx x 4 days; pt seen at [facility] and given meds but feeling worse

## 2016-12-29 NOTE — ED Provider Notes (Signed)
[facility] DEPT Provider Note   CSN: [id number] Arrival date & time: 12/29/16  2703     History   Chief Complaint Chief Complaint  Patient presents with  . Cough    HPI Sarah Ayers is a 41 y.o. female.  The history is provided by the patient and medical records. No language interpreter was used.  Cough  Associated symptoms include myalgias. Pertinent negatives include no chills, no sore throat, no shortness of breath and no wheezing.   Sarah Ayers is a 41 y.o. female  with no pertinent PMH who presents to the Emergency Department complaining of persistent cough 5 days. She was seen at urgent care 4 days ago where she was given a Z-Pak, steroid shot and hydrocodone cough syrup. She states that symptoms have persisted with little improvement. She did have some nasal congestion which has improved, but cough has been persistent. Hydrocodone cough syrup because help, but makes her very drowsy, therefore she does not want to take this during the day and has only been using it at night. She is a daily smoker, but states that she has not been smoking this week due to her symptoms. No fever or chills. She does endorse chest pain that occurs with coughing, but points more to her lateral rib cage area than her chest. No abdominal pain, nausea, vomiting, shortness of breath.  Past Medical History:  Diagnosis Date  . Acute meniscal tear, lateral RIGHT  . Knee swelling, right     Patient Active Problem List   Diagnosis Date Noted  . Nicotine abuse 08/29/2011  . Obesity (BMI 35.0-39.9 without comorbidity) 08/29/2011    Past Surgical History:  Procedure Laterality Date  . MENISECTOMY  10/03/2011   Procedure: MENISECTOMY;  Surgeon: Magnus Sinning, MD;  Location: [facility];  Service: Orthopedics;  Laterality: Right;   arthroscopic partial lateral menisectomy  . WISDOM TOOTH EXTRACTION  AGE 63   ORAL SURGEON OFFICE    OB History    No data available       Home  Medications    Prior to Admission medications   Medication Sig Start Date End Date Taking? Authorizing Provider  benzonatate (TESSALON) 100 MG capsule Take 1 capsule (100 mg total) by mouth 3 (three) times daily as needed for cough. 12/29/16   Mylo Choi, Ozella Almond, PA-C    Family History History reviewed. No pertinent family history.  Social History Social History  Substance Use Topics  . Smoking status: Current Every Day Smoker    Packs/day: 0.25    Years: 3.00  . Smokeless tobacco: Never Used  . Alcohol use Yes     Comment: OCCASIONAL     Allergies   Patient has no known allergies.   Review of Systems Review of Systems  Constitutional: Negative for chills and fever.  HENT: Positive for congestion. Negative for sore throat.   Respiratory: Positive for cough. Negative for shortness of breath and wheezing.   Musculoskeletal: Positive for myalgias.     Physical Exam Updated Vital Signs BP 118/72 (BP Location: Right Arm)   Pulse 93   Temp 98.3 F (36.8 C) (Oral)   Resp 18   LMP 12/28/2016 (Within Days) Comment: on MP now  SpO2 100%   Physical Exam  Constitutional: She is oriented to person, place, and time. She appears well-developed and well-nourished. No distress.  HENT:  Head: Normocephalic and atraumatic.  OP with erythema, no exudates or tonsillar hypertrophy. + nasal congestion with mucosal edema.  Neck: Normal range of motion. Neck supple.  Cardiovascular: Normal rate, regular rhythm and normal heart sounds.   Pulmonary/Chest: Effort normal. She exhibits tenderness.  Lungs are clear to auscultation bilaterally - no w/r/r  Abdominal: Soft. She exhibits no distension. There is no tenderness.  Musculoskeletal: Normal range of motion.  Neurological: She is alert and oriented to person, place, and time.  Skin: Skin is warm and dry. She is not diaphoretic.  Nursing note and vitals reviewed.    ED Treatments / Results  Labs (all labs ordered are listed, but  only abnormal results are displayed) Labs Reviewed - No data to display  EKG  EKG Interpretation None       Radiology Dg Chest 2 View  Result Date: 12/29/2016 CLINICAL DATA:  Chest pain and cough since Tuesday with worsening last night. Sore throat, weakness and chest pain. Diaphoresis. EXAM: CHEST  2 VIEW COMPARISON:  None. FINDINGS: Trachea is midline. Heart size normal. Lungs are clear. No pleural fluid. IMPRESSION: No acute findings. Electronically Signed   By: Lorin Picket M.D.   On: 12/29/2016 09:27    Procedures Procedures (including critical care time)  Medications Ordered in ED Medications  ketorolac (TORADOL) injection 30 mg (30 mg Intramuscular Given 12/29/16 0950)     Initial Impression / Assessment and Plan / ED Course  I have reviewed the triage vital signs and the nursing notes.  Pertinent labs & imaging results that were available during my care of the patient were reviewed by me and considered in my medical decision making (see chart for details).    Sarah Ayers is a 41 y.o. female who presents to ED for persistent cough and congestion x 1 week. On exam, patient is afebrile, non-toxic appearing with a clear lung exam. Mild rhinorrhea and OP with erythema but no exudates or tonsillar hypertrophy. CXR negative. Patient just recently finished course of azithromycin yesterday. Do not see indication at this time with symptoms less than one week for another round of ABX. Patient has rx for hydrocodone cough syrup but this makes her very drowsy, therefore she isn't taking it during the day. Will give rx for tessalon for daytime use. Toradol given in ED for musk chest and flank discomfort. Symptomatic home care instructions discussed. PCP follow up strongly encouraged if symptoms persist. Reasons to return to ER discussed. All questions answered.   Blood pressure 118/72, pulse 93, temperature 98.3 F (36.8 C), temperature source Oral, resp. rate 18, last menstrual period  12/28/2016, SpO2 100 %.   Final Clinical Impressions(s) / ED Diagnoses   Final diagnoses:  Cough  Upper respiratory tract infection, unspecified type    New Prescriptions New Prescriptions   BENZONATATE (TESSALON) 100 MG CAPSULE    Take 1 capsule (100 mg total) by mouth 3 (three) times daily as needed for cough.     Signa Cheek, Ozella Almond, PA-C 12/29/16 1027    Duffy Bruce, MD 12/30/16 1247

## 2017-02-10 DIAGNOSIS — R509 Fever, unspecified: Secondary | ICD-10-CM | POA: Diagnosis not present

## 2017-02-10 DIAGNOSIS — R079 Chest pain, unspecified: Secondary | ICD-10-CM | POA: Diagnosis not present

## 2017-02-10 DIAGNOSIS — D649 Anemia, unspecified: Secondary | ICD-10-CM | POA: Diagnosis not present

## 2017-02-14 ENCOUNTER — Other Ambulatory Visit: Payer: Self-pay

## 2017-02-14 ENCOUNTER — Emergency Department (HOSPITAL_COMMUNITY): Payer: BLUE CROSS/BLUE SHIELD

## 2017-02-14 ENCOUNTER — Inpatient Hospital Stay (HOSPITAL_COMMUNITY)
Admission: EM | Admit: 2017-02-14 | Discharge: 2017-02-17 | DRG: 176 | Disposition: A | Discharge status: Home / Self Care | Payer: BLUE CROSS/BLUE SHIELD | Attending: Internal Medicine | Admitting: Internal Medicine

## 2017-02-14 ENCOUNTER — Encounter (HOSPITAL_COMMUNITY): Payer: Self-pay | Admitting: Emergency Medicine

## 2017-02-14 DIAGNOSIS — D509 Iron deficiency anemia, unspecified: Secondary | ICD-10-CM | POA: Diagnosis not present

## 2017-02-14 DIAGNOSIS — I361 Nonrheumatic tricuspid (valve) insufficiency: Secondary | ICD-10-CM | POA: Diagnosis not present

## 2017-02-14 DIAGNOSIS — D649 Anemia, unspecified: Secondary | ICD-10-CM

## 2017-02-14 DIAGNOSIS — Z79899 Other long term (current) drug therapy: Secondary | ICD-10-CM

## 2017-02-14 DIAGNOSIS — I82431 Acute embolism and thrombosis of right popliteal vein: Secondary | ICD-10-CM | POA: Diagnosis present

## 2017-02-14 DIAGNOSIS — I2699 Other pulmonary embolism without acute cor pulmonale: Principal | ICD-10-CM | POA: Diagnosis present

## 2017-02-14 DIAGNOSIS — N920 Excessive and frequent menstruation with regular cycle: Secondary | ICD-10-CM | POA: Diagnosis not present

## 2017-02-14 DIAGNOSIS — I82441 Acute embolism and thrombosis of right tibial vein: Secondary | ICD-10-CM | POA: Diagnosis present

## 2017-02-14 DIAGNOSIS — R7989 Other specified abnormal findings of blood chemistry: Secondary | ICD-10-CM | POA: Diagnosis present

## 2017-02-14 DIAGNOSIS — R0602 Shortness of breath: Secondary | ICD-10-CM | POA: Diagnosis not present

## 2017-02-14 DIAGNOSIS — I82411 Acute embolism and thrombosis of right femoral vein: Secondary | ICD-10-CM | POA: Diagnosis present

## 2017-02-14 DIAGNOSIS — R195 Other fecal abnormalities: Secondary | ICD-10-CM | POA: Diagnosis present

## 2017-02-14 DIAGNOSIS — D75839 Thrombocytosis, unspecified: Secondary | ICD-10-CM | POA: Diagnosis present

## 2017-02-14 DIAGNOSIS — D473 Essential (hemorrhagic) thrombocythemia: Secondary | ICD-10-CM | POA: Diagnosis present

## 2017-02-14 DIAGNOSIS — R0781 Pleurodynia: Secondary | ICD-10-CM | POA: Diagnosis present

## 2017-02-14 DIAGNOSIS — R0603 Acute respiratory distress: Secondary | ICD-10-CM | POA: Diagnosis present

## 2017-02-14 DIAGNOSIS — R079 Chest pain, unspecified: Secondary | ICD-10-CM | POA: Diagnosis not present

## 2017-02-14 DIAGNOSIS — Z86711 Personal history of pulmonary embolism: Secondary | ICD-10-CM | POA: Diagnosis not present

## 2017-02-14 DIAGNOSIS — R05 Cough: Secondary | ICD-10-CM | POA: Diagnosis not present

## 2017-02-14 LAB — CBC
HCT: 25.6 % — ABNORMAL LOW (ref 36.0–46.0)
Hemoglobin: 7.8 g/dL — ABNORMAL LOW (ref 12.0–15.0)
MCH: 21.5 pg — ABNORMAL LOW (ref 26.0–34.0)
MCHC: 30.5 g/dL (ref 30.0–36.0)
MCV: 70.7 fL — ABNORMAL LOW (ref 78.0–100.0)
Platelets: 546 10*3/uL — ABNORMAL HIGH (ref 150–400)
RBC: 3.62 MIL/uL — ABNORMAL LOW (ref 3.87–5.11)
RDW: 19.7 % — ABNORMAL HIGH (ref 11.5–15.5)
WBC: 8.9 10*3/uL (ref 4.0–10.5)

## 2017-02-14 LAB — I-STAT TROPONIN, ED: Troponin i, poc: 0 ng/mL (ref 0.00–0.08)

## 2017-02-14 LAB — BASIC METABOLIC PANEL
Anion gap: 10 (ref 5–15)
BUN: 8 mg/dL (ref 6–20)
CO2: 21 mmol/L — ABNORMAL LOW (ref 22–32)
Calcium: 8.6 mg/dL — ABNORMAL LOW (ref 8.9–10.3)
Chloride: 105 mmol/L (ref 101–111)
Creatinine, Ser: 0.74 mg/dL (ref 0.44–1.00)
GFR calc Af Amer: >60 mL/min (ref >60)
GFR calc non Af Amer: >60 mL/min (ref >60)
Glucose, Bld: 110 mg/dL — ABNORMAL HIGH (ref 65–99)
Potassium: 3.7 mmol/L (ref 3.5–5.1)
Sodium: 136 mmol/L (ref 135–145)

## 2017-02-14 LAB — FOLATE: Folate: 16.8 ng/mL (ref >5.9)

## 2017-02-14 LAB — I-STAT BETA HCG BLOOD, ED (MC, WL, AP ONLY): I-stat hCG, quantitative: <5 m[IU]/mL (ref <5)

## 2017-02-14 LAB — RETICULOCYTES
RBC.: 3.58 MIL/uL — ABNORMAL LOW (ref 3.87–5.11)
Retic Count, Absolute: 39.4 10*3/uL (ref 19.0–186.0)
Retic Ct Pct: 1.1 % (ref 0.4–3.1)

## 2017-02-14 LAB — IRON AND TIBC
Iron: 11 ug/dL — ABNORMAL LOW (ref 28–170)
Saturation Ratios: 3 % — ABNORMAL LOW (ref 10.4–31.8)
TIBC: 374 ug/dL (ref 250–450)
UIBC: 363 ug/dL

## 2017-02-14 LAB — VITAMIN B12: Vitamin B-12: 339 pg/mL (ref 180–914)

## 2017-02-14 LAB — FERRITIN: Ferritin: 22 ng/mL (ref 11–307)

## 2017-02-14 MED ORDER — IOPAMIDOL (ISOVUE-370) INJECTION 76%
INTRAVENOUS | Status: AC
  Administered 2017-02-14: 100 mL via INTRAVENOUS
  Filled 2017-02-14: qty 100

## 2017-02-14 NOTE — ED Triage Notes (Signed)
Pt. Stated, I started having SOB like I was walking a marathon with chest pain that goes around to the right.  This same thing happened 2 weeks ago.

## 2017-02-14 NOTE — ED Provider Notes (Signed)
[facility] DEPT Provider Note   CSN: [id number] Arrival date & time: 02/14/17  1721     History   Chief Complaint Chief Complaint  Patient presents with  . Shortness of Breath  . Chest Pain  . Headache  . Cough    HPI Sarah Ayers is a 41 y.o. female.  HPI   Sarah Ayers is a 41yo female with no significant past medical history who presents to the Emergency Department with complaint of shortness of breath, chest pain and cough which has been worsening over the past 4 days. She states that her symptoms started off with productive cough. She reports that she was seen at Urgent Care 4 days ago and was told that she had an URI and anemia, sent home with a Z-pack and iron pills. Since that time she has had worsening shortness of breath, she states that she now gets short of breath with walking a few feet or when she turns on her side. With the shortness of breath she has sternal chest pain which radiates to her right lateral chest wall. The chest pain is constant, a 10/10 in severity and feels like a "cramping pain." She states that she has taken the iron pills that have been prescribed to her and the full course of the Zpac. She denies OCP use, no history of cancer, no prior DVT, no recent surgery, no history of long periods of time stationary, no leg swelling. Prior to this week she denies history of anemia, or family history of anemia. She also endorses a mild headache on the right temporal side of her head which has been ongoing for several days now. No N/V, numbness, weakness, vision changes, photophobia.  Past Medical History:  Diagnosis Date  . Acute meniscal tear, lateral RIGHT  . Knee swelling, right     Patient Active Problem List   Diagnosis Date Noted  . Microcytic anemia 02/14/2017  . Pleuritic pain 02/14/2017  . Respiratory distress 02/14/2017  . Thrombocytosis ([place]) 02/14/2017  . Chest pain 02/14/2017  . Nicotine abuse 08/29/2011  . Obesity (BMI 35.0-39.9 without  comorbidity) 08/29/2011    Past Surgical History:  Procedure Laterality Date  . MENISECTOMY  10/03/2011   Procedure: MENISECTOMY;  Surgeon: Magnus Sinning, MD;  Location: [facility];  Service: Orthopedics;  Laterality: Right;   arthroscopic partial lateral menisectomy  . WISDOM TOOTH EXTRACTION  AGE 51   ORAL SURGEON OFFICE    OB History    No data available       Home Medications    Prior to Admission medications   Medication Sig Start Date End Date Taking? Authorizing Provider  azithromycin (ZITHROMAX) 250 MG tablet Take 250 mg by mouth daily. 02/10/17  Yes [provider]  ferrous sulfate 325 (65 FE) MG tablet Take 325 mg by mouth daily with breakfast.   Yes [provider]  Multiple Vitamin (MULTIVITAMIN) tablet Take 1 tablet by mouth daily. Alive   Yes [provider]  benzonatate (TESSALON) 100 MG capsule Take 1 capsule (100 mg total) by mouth 3 (three) times daily as needed for cough. Patient not taking: Reported on 02/14/2017 12/29/16   Ward, Ozella Almond, PA-C    Family History History reviewed. No pertinent family history.  Social History Social History  Substance Use Topics  . Smoking status: Current Every Day Smoker    Packs/day: 0.25    Years: 3.00  . Smokeless tobacco: Never Used  . Alcohol use Yes  Comment: OCCASIONAL     Allergies   Patient has no known allergies.   Review of Systems Review of Systems  Constitutional: Positive for fatigue and fever (tactile). Negative for chills.  HENT: Negative for rhinorrhea and sore throat.   Eyes: Negative for visual disturbance.  Respiratory: Positive for cough, chest tightness and shortness of breath.   Cardiovascular: Positive for chest pain. Negative for palpitations and leg swelling.  Gastrointestinal: Negative for abdominal pain, constipation, diarrhea and vomiting.  Genitourinary: Negative for difficulty urinating, dysuria and hematuria.  Musculoskeletal:  Negative for arthralgias and back pain.  Skin: Negative for pallor, rash and wound.  Neurological: Positive for headaches. Negative for weakness, light-headedness and numbness.  Psychiatric/Behavioral: Negative for agitation.     Physical Exam Updated Vital Signs BP 122/76   Pulse 91   Temp 98.4 F (36.9 C) (Oral)   Resp (!) 29   Ht 5\' 4"  (1.626 m)   Wt 99.8 kg (220 lb)   LMP 01/29/2017   SpO2 96%   BMI 37.76 kg/m   Physical Exam  Constitutional: She is oriented to person, place, and time. She appears well-developed and well-nourished. No distress.  Patient appears uncomfortable in the bed.   HENT:  Head: Normocephalic and atraumatic.  Mouth/Throat: Oropharynx is clear and moist. No oropharyngeal exudate.  Eyes: Pupils are equal, round, and reactive to light. Conjunctivae and EOM are normal. Right eye exhibits no discharge. Left eye exhibits no discharge.  Neck: Normal range of motion. Neck supple.  Cardiovascular: Normal rate.  Exam reveals no friction rub.   No murmur heard. tachycardic  Pulmonary/Chest: Breath sounds normal. No respiratory distress. She has no wheezes. She has no rales.  Patient is tachypnic, no accessory muscle use or retractions. She is acutely tender to palpation over the lateral right ribcage.   Abdominal: Soft. Bowel sounds are normal. She exhibits no distension. There is no tenderness.  Musculoskeletal: Normal range of motion.  Neurological: She is alert and oriented to person, place, and time.  Mental Status:  Alert, oriented, thought content appropriate, able to give a coherent history. Speech fluent without evidence of aphasia. Able to follow 2 step commands without difficulty.  Cranial Nerves:  II:  Peripheral visual fields grossly normal, pupils equal, round, reactive to light III,IV, VI: ptosis not present, extra-ocular motions intact bilaterally  V,VII: smile symmetric, facial light touch sensation equal VIII: hearing grossly normal to voice   X: uvula elevates symmetrically  XI: bilateral shoulder shrug symmetric and strong XII: midline tongue extension without fassiculations Motor:  Normal tone. 5/5 in upper and lower extremities bilaterally including strong and equal grip strength and dorsiflexion/plantar flexion Sensory: Pinprick and light touch normal in all extremities.  Deep Tendon Reflexes: 2+ and symmetric in the biceps and patella Cerebellar: normal finger-to-nose with bilateral upper extremities CV: distal pulses palpable throughout   Skin: Skin is warm and dry. She is not diaphoretic.  Psychiatric: She has a normal mood and affect.  Nursing note and vitals reviewed.    ED Treatments / Results  Labs (all labs ordered are listed, but only abnormal results are displayed) Labs Reviewed  BASIC METABOLIC PANEL - Abnormal; Notable for the following:       Result Value   CO2 21 (*)    Glucose, Bld 110 (*)    Calcium 8.6 (*)    All other components within normal limits  CBC - Abnormal; Notable for the following:    RBC 3.62 (*)    Hemoglobin  7.8 (*)    HCT 25.6 (*)    MCV 70.7 (*)    MCH 21.5 (*)    RDW 19.7 (*)    Platelets 546 (*)    All other components within normal limits  IRON AND TIBC - Abnormal; Notable for the following:    Iron 11 (*)    Saturation Ratios 3 (*)    All other components within normal limits  RETICULOCYTES - Abnormal; Notable for the following:    RBC. 3.58 (*)    All other components within normal limits  VITAMIN B12  FOLATE  FERRITIN  OCCULT BLOOD X 1 CARD TO LAB, STOOL  HEPARIN LEVEL (UNFRACTIONATED)  I-STAT TROPONIN, ED  I-STAT BETA HCG BLOOD, ED ([facility], [facility], [facility] ONLY)  POC OCCULT BLOOD, ED  TYPE AND SCREEN  ABO/RH    EKG  EKG Interpretation  Date/Time:  Saturday February 14 2017 17:20:08 EDT Ventricular Rate:  107 PR Interval:  154 QRS Duration: 70 QT Interval:  322 QTC Calculation: 429 R Axis:   17 Text Interpretation:  Sinus tachycardia Nonspecific ST  abnormality Abnormal ECG No acute changes No significant change since last tracing Confirmed by Varney Biles [phone]) on 02/14/2017 8:41:22 PM       Radiology Dg Chest 2 View  Result Date: 02/14/2017 CLINICAL DATA:  Dyspnea and productive cough with mid right-sided chest pain and chills x1 month. EXAM: CHEST  2 VIEW COMPARISON:  None. FINDINGS: Heart is top-normal in size. No aortic aneurysm or mediastinal widening. Mild central vascular congestion is seen with small left effusion and trace fluid in the right major fissure no acute pneumonic consolidation. No pneumothorax. No acute nor suspicious osseous abnormalities. IMPRESSION: Mild central vascular congestion with fluid in the right major fissure and at the left lung base. No acute pneumonic consolidation. Electronically Signed   By: Ashley Royalty M.D.   On: 02/14/2017 18:23   Ct Angio Chest Pe W And/or Wo Contrast  Result Date: 02/15/2017 CLINICAL DATA:  41 y/o F; shortness of breath. PE suspected, high pretest probability. EXAM: CT ANGIOGRAPHY CHEST WITH CONTRAST TECHNIQUE: Multidetector CT imaging of the chest was performed using the standard protocol during bolus administration of intravenous contrast. Multiplanar CT image reconstructions and MIPs were obtained to evaluate the vascular anatomy. CONTRAST:  100 cc Isovue 370 COMPARISON:  None. FINDINGS: Cardiovascular: Large right-sided pulmonary embolus involving right main pulmonary artery, right lower lobe pulmonary artery, multiple segmental pulmonary arteries. RV/LV equals 0.8. Normal heart size.  No pericardial.  Normal caliber thoracic aorta. Mediastinum/Nodes: No enlarged mediastinal, hilar, or axillary lymph nodes. Thyroid gland, trachea, and esophagus demonstrate no significant findings. Lungs/Pleura: Small bilateral pleural effusions. Peripheral consolidation in the right upper lobe may represent a pulmonary infarction (series 8, image 111). Upper Abdomen: No acute abnormality.  Musculoskeletal: No chest wall abnormality. No acute or significant osseous findings. Review of the MIP images confirms the above findings. IMPRESSION: 1. Large right-sided acute pulmonary embolus. No findings of right heart strain. 2. Small right upper lobe peripheral consolidation may represent pulmonary infarction. 3. Small bilateral pleural effusions. These results were called by telephone at the time of interpretation on 02/15/2017 at 12:38 am to Dr. Varney Biles , who verbally acknowledged these results. Electronically Signed   By: Kristine Garbe M.D.   On: 02/15/2017 00:41    Procedures Procedures (including critical care time)  CRITICAL CARE Performed by: Glyn Ade   Total critical care time: 45 minutes  Critical care time was exclusive  of separately billable procedures and treating other patients.  Critical care was necessary to treat or prevent imminent or life-threatening deterioration.  Critical care was time spent personally by me on the following activities: development of treatment plan with patient and/or surrogate as well as nursing, discussions with consultants, evaluation of patient's response to treatment, examination of patient, obtaining history from patient or surrogate, ordering and performing treatments and interventions, ordering and review of laboratory studies, ordering and review of radiographic studies, pulse oximetry and re-evaluation of patient's condition.   Medications Ordered in ED Medications  heparin bolus via infusion 4,000 Units (not administered)  heparin ADULT infusion 100 units/mL (25000 units/244mL sodium chloride 0.45%) (not administered)  iopamidol (ISOVUE-370) 76 % injection (100 mLs Intravenous Contrast Given 02/14/17 2353)     Initial Impression / Assessment and Plan / ED Course  I have reviewed the triage vital signs and the nursing notes.  Pertinent labs & imaging results that were available during my care of the  patient were reviewed by me and considered in my medical decision making (see chart for details).  Clinical Course as of Feb 16 116  Nancy Fetter Feb 15, 2017  0052 Hemocult positive stools. No melena or gross blood noted. Will start heparin. Diagnosis of PE discussed with with Dr. Myna Hidalgo.  [ES]    Clinical Course User Index [ES] Glyn Ade, PA-C    Patient with PE found on CT angio. No right heart strain. Started on heparin in the ED. She is also anemic with Hgb 7.8. Hospitalist service will admit patient for PE and work-up of her anemia.   Final Clinical Impressions(s) / ED Diagnoses   Final diagnoses:  Other acute pulmonary embolism without acute cor pulmonale (HCC)  Anemia, unspecified type    New Prescriptions New Prescriptions   No medications on file     Bernarda Caffey 02/15/17 0118    Varney Biles, MD 02/15/17 [phone]

## 2017-02-15 ENCOUNTER — Encounter (HOSPITAL_COMMUNITY): Payer: Self-pay | Admitting: Family Medicine

## 2017-02-15 ENCOUNTER — Inpatient Hospital Stay (HOSPITAL_COMMUNITY): Payer: BLUE CROSS/BLUE SHIELD

## 2017-02-15 DIAGNOSIS — R0781 Pleurodynia: Secondary | ICD-10-CM

## 2017-02-15 DIAGNOSIS — I361 Nonrheumatic tricuspid (valve) insufficiency: Secondary | ICD-10-CM | POA: Diagnosis not present

## 2017-02-15 DIAGNOSIS — I82441 Acute embolism and thrombosis of right tibial vein: Secondary | ICD-10-CM | POA: Diagnosis present

## 2017-02-15 DIAGNOSIS — R195 Other fecal abnormalities: Secondary | ICD-10-CM

## 2017-02-15 DIAGNOSIS — Z86711 Personal history of pulmonary embolism: Secondary | ICD-10-CM | POA: Diagnosis not present

## 2017-02-15 DIAGNOSIS — R0602 Shortness of breath: Secondary | ICD-10-CM | POA: Diagnosis present

## 2017-02-15 DIAGNOSIS — R7989 Other specified abnormal findings of blood chemistry: Secondary | ICD-10-CM | POA: Diagnosis present

## 2017-02-15 DIAGNOSIS — N920 Excessive and frequent menstruation with regular cycle: Secondary | ICD-10-CM | POA: Diagnosis present

## 2017-02-15 DIAGNOSIS — I2699 Other pulmonary embolism without acute cor pulmonale: Secondary | ICD-10-CM | POA: Diagnosis present

## 2017-02-15 DIAGNOSIS — Z79899 Other long term (current) drug therapy: Secondary | ICD-10-CM | POA: Diagnosis not present

## 2017-02-15 DIAGNOSIS — D649 Anemia, unspecified: Secondary | ICD-10-CM | POA: Diagnosis not present

## 2017-02-15 DIAGNOSIS — I82411 Acute embolism and thrombosis of right femoral vein: Secondary | ICD-10-CM | POA: Diagnosis present

## 2017-02-15 DIAGNOSIS — I82431 Acute embolism and thrombosis of right popliteal vein: Secondary | ICD-10-CM | POA: Diagnosis present

## 2017-02-15 DIAGNOSIS — D509 Iron deficiency anemia, unspecified: Secondary | ICD-10-CM | POA: Diagnosis not present

## 2017-02-15 LAB — PREPARE RBC (CROSSMATCH)

## 2017-02-15 LAB — BASIC METABOLIC PANEL
Anion gap: 7 (ref 5–15)
BUN: 6 mg/dL (ref 6–20)
CO2: 22 mmol/L (ref 22–32)
Calcium: 8.5 mg/dL — ABNORMAL LOW (ref 8.9–10.3)
Chloride: 107 mmol/L (ref 101–111)
Creatinine, Ser: 0.76 mg/dL (ref 0.44–1.00)
GFR calc Af Amer: >60 mL/min (ref >60)
GFR calc non Af Amer: >60 mL/min (ref >60)
Glucose, Bld: 96 mg/dL (ref 65–99)
Potassium: 3.6 mmol/L (ref 3.5–5.1)
Sodium: 136 mmol/L (ref 135–145)

## 2017-02-15 LAB — TROPONIN I
Troponin I: <0.03 ng/mL (ref <0.03)
Troponin I: <0.03 ng/mL (ref <0.03)
Troponin I: <0.03 ng/mL (ref <0.03)

## 2017-02-15 LAB — HEPARIN LEVEL (UNFRACTIONATED)
Heparin Unfractionated: 0.18 IU/mL — ABNORMAL LOW (ref 0.30–0.70)
Heparin Unfractionated: 0.31 IU/mL (ref 0.30–0.70)
Heparin Unfractionated: 0.76 IU/mL — ABNORMAL HIGH (ref 0.30–0.70)
Heparin Unfractionated: 0.97 IU/mL — ABNORMAL HIGH (ref 0.30–0.70)

## 2017-02-15 LAB — ABO/RH: ABO/RH(D): B POS

## 2017-02-15 LAB — HIV ANTIBODY (ROUTINE TESTING W REFLEX): HIV Screen 4th Generation wRfx: NONREACTIVE

## 2017-02-15 MED ORDER — PANTOPRAZOLE SODIUM 40 MG IV SOLR
40.0000 mg | Freq: Two times a day (BID) | INTRAVENOUS | Status: DC
  Administered 2017-02-15 – 2017-02-17 (×6): 40 mg via INTRAVENOUS
  Filled 2017-02-15 (×6): qty 40

## 2017-02-15 MED ORDER — SODIUM CHLORIDE 0.9% FLUSH
3.0000 mL | Freq: Two times a day (BID) | INTRAVENOUS | Status: DC
  Administered 2017-02-15 – 2017-02-16 (×4): 3 mL via INTRAVENOUS

## 2017-02-15 MED ORDER — ONDANSETRON HCL 4 MG/2ML IJ SOLN: 4.0000 mg | Freq: Four times a day (QID) | INTRAMUSCULAR | Status: DC | PRN

## 2017-02-15 MED ORDER — HEPARIN BOLUS VIA INFUSION
4000.0000 [IU] | Freq: Once | INTRAVENOUS | Status: AC
  Administered 2017-02-15: 4000 [IU] via INTRAVENOUS
  Filled 2017-02-15: qty 4000

## 2017-02-15 MED ORDER — ACETAMINOPHEN 325 MG PO TABS: 650.0000 mg | ORAL_TABLET | Freq: Four times a day (QID) | ORAL | Status: DC | PRN

## 2017-02-15 MED ORDER — ONDANSETRON HCL 4 MG PO TABS: 4.0000 mg | ORAL_TABLET | Freq: Four times a day (QID) | ORAL | Status: DC | PRN

## 2017-02-15 MED ORDER — HYDROCODONE-ACETAMINOPHEN 5-325 MG PO TABS
1.0000 | ORAL_TABLET | ORAL | Status: DC | PRN
  Administered 2017-02-15: 1 via ORAL
  Administered 2017-02-15: 2 via ORAL
  Filled 2017-02-15: qty 1
  Filled 2017-02-15: qty 2

## 2017-02-15 MED ORDER — HEPARIN (PORCINE) IN NACL 100-0.45 UNIT/ML-% IJ SOLN
1350.0000 [IU]/h | INTRAMUSCULAR | Status: DC
  Administered 2017-02-15: 1400 [IU]/h via INTRAVENOUS
  Administered 2017-02-16: 1600 [IU]/h via INTRAVENOUS
  Administered 2017-02-17: 1400 [IU]/h via INTRAVENOUS
  Filled 2017-02-15 (×4): qty 250

## 2017-02-15 MED ORDER — ACETAMINOPHEN 650 MG RE SUPP: 650.0000 mg | Freq: Four times a day (QID) | RECTAL | Status: DC | PRN

## 2017-02-15 MED ORDER — ADULT MULTIVITAMIN W/MINERALS CH
1.0000 | ORAL_TABLET | Freq: Every day | ORAL | Status: DC
  Administered 2017-02-15 – 2017-02-17 (×3): 1 via ORAL
  Filled 2017-02-15 (×3): qty 1

## 2017-02-15 NOTE — Progress Notes (Signed)
ANTICOAGULATION CONSULT NOTE - Initial Consult  Pharmacy Consult for Heparin Indication: pulmonary embolus  No Known Allergies  Patient Measurements: Height: 5\' 4"  (162.6 cm) Weight: 224 lb 12.8 oz (102 kg) IBW/kg (Calculated) : 54.7 Heparin Dosing Weight: 80 kg  Vital Signs: Temp: 98.5 F (36.9 C) (09/30 0524) Temp Source: Oral (09/30 0524) BP: 116/60 (09/30 0524) Pulse Rate: 80 (09/30 0524)  Labs:  Recent Labs  02/14/17 1732 02/15/17 0149 02/15/17 0734  HGB 7.8*  --   --   HCT 25.6*  --   --   PLT 546*  --   --   HEPARINUNFRC  --   --  0.31  CREATININE 0.74 0.76  --   TROPONINI  --  <0.03  --     Estimated Creatinine Clearance: 110.8 mL/min (by C-G formula based on SCr of 0.76 mg/dL).   Medical History: Past Medical History:  Diagnosis Date  . Acute meniscal tear, lateral RIGHT  . Knee swelling, right     Medications:  No current facility-administered medications on file prior to encounter.    Current Outpatient Prescriptions on File Prior to Encounter  Medication Sig Dispense Refill  . benzonatate (TESSALON) 100 MG capsule Take 1 capsule (100 mg total) by mouth 3 (three) times daily as needed for cough. (Patient not taking: Reported on 02/14/2017) 21 capsule 0     Assessment: Sarah Ayers with large right-sided PE, pharmacy consulted for IV heparin.   Admission Hgb low 7.8, pltc 546. DRE without melena or gross blood, but occult blood positive. Denies abdominal pain, indigestion, N/V, melena, or hematochezia; acknowledges heavy periods. GI consulted, on protonix 40mg  IV q12h.  Initial heparin level just therapeutic at 0.31. No CBC today, f/u tomorrow. No overt bleeding noted.  Goal of Therapy:  Heparin level 0.3-0.7 units/ml Monitor platelets by anticoagulation protocol: Yes   Plan:  Continue heparin gtt at 1400 units/hr Recheck heparin level in 6 hours Daily heparin level and CBC while on heparin Monitor closely for s/sx of bleeding   Cohan Stipes N. Gerarda Fraction,  PharmD PGY1 Pharmacy Resident Pager: [phone]

## 2017-02-15 NOTE — Progress Notes (Signed)
VASCULAR LAB PRELIMINARY  PRELIMINARY  PRELIMINARY  PRELIMINARY  Bilateral lower extremity venous duplex completed.    Preliminary report:  There is subacute DVT noted in the right common femoral, proximal femoral, profunda, popliteal, posterior tibial, and peroneal veins.  There is no propagation noted to the left lower extremity or to the right iliac veins.   Xandra Laramee, RVT 02/15/2017, 11:26 AM

## 2017-02-15 NOTE — Progress Notes (Signed)
Sarah Ayers is a 41 y.o. female who denies any significant past medical history, now presenting to the emergency department for evaluation of shortness of breath and chest pain. SHE WAS FOUND to have PE. She was started on IV heparin. Echocardiogram and LE duplex ordered. She was also found to have microcytic anemia, started on PPI, .   Hosie Poisson, MD [phone]

## 2017-02-15 NOTE — H&P (Signed)
History and Physical    Sarah Ayers HEN:[id number] DOB: 1975-09-23 DOA: 02/14/2017  PCP: Patient, No Pcp Per   Patient coming from: Home   Chief Complaint: Chest pain, shortness of breath  HPI: Sarah Ayers is a 41 y.o. female who denies any significant past medical history, now presenting to the emergency department for evaluation of shortness of breath and chest pain. Patient reports that she been in her usual state of health until approximately one month ago when she noted the insidious development of pain in the central chest. She also had some mild dyspnea with exertion at that time. Over the last month, symptoms have slowly worsened and she was evaluated at an urgent care, diagnosed with URI, and treated with a Z-Pak. She was also noted to have a microcytic anemia at that time and started on iron tabs. Symptoms persisted unchanged, possibly even worsened, despite the CPAP, and she was seen in an emergency department, diagnosed with likely pneumonia, given another Z-Pak, but failed to make any appreciable improvement. She denies any fevers, reports an occasional cough which is mainly nonproductive, but has ongoing pain, now more in the right chest, worse with inspiration or cough. She had never experienced similar symptoms previously, denies any personal or family history of VTE, denies prolonged immobilization, and does not use oral contraception. Denies melena or hematochezia, denies abdominal pain, but reports that she does have heavy periods.   ED Course: Upon arrival to the ED, patient is found to beafebrile, Septra and well on room air, slightly tachycardic, and with vitals otherwise stable. EKG features a sinus tachycardia with rate 107. Chest x-ray is notable for mild central venous congestion. Chemistry panel is unremarkable and CBC is notable for a microcytic anemia with hemoglobin of 7.8 and MCV of 70.7, as well as a thrombocytosis with platelets 546,000. CTA chest reveals a large  right-sided pulmonary embolism without right heart strain. There was no gross dorm melanoma on DRE, but sample tested positive for occult blood. Type and screen was performed, gastroenterology was consulted by the ED physician, and the patient was started on heparin infusion. She has remained slightly tachycardic, but otherwise stable in the ED, and will be admitted to the telemetry unit for ongoing evaluation and management of chest pain and dyspnea secondary to large right-sided PE, complicated by occult GI bleeding with microcytic anemia.  Review of Systems:  All other systems reviewed and apart from HPI, are negative.  Past Medical History:  Diagnosis Date  . Acute meniscal tear, lateral RIGHT  . Knee swelling, right     Past Surgical History:  Procedure Laterality Date  . MENISECTOMY  10/03/2011   Procedure: MENISECTOMY;  Surgeon: Magnus Sinning, MD;  Location: [facility];  Service: Orthopedics;  Laterality: Right;   arthroscopic partial lateral menisectomy  . WISDOM TOOTH EXTRACTION  AGE 89   [facility]     reports that she has been smoking.  She has a 0.75 pack-year smoking history. She has never used smokeless tobacco. She reports that she drinks alcohol. She reports that she does not use drugs.  No Known Allergies  Family History  Problem Relation Age of Onset  . Bleeding Disorder Maternal Grandmother   . Deep vein thrombosis Neg Hx   . Pulmonary embolism Neg Hx      Prior to Admission medications   Medication Sig Start Date End Date Taking? Authorizing Provider  azithromycin (ZITHROMAX) 250 MG tablet Take 250 mg by mouth daily. 02/10/17  Yes [provider]  ferrous sulfate 325 (65 FE) MG tablet Take 325 mg by mouth daily with breakfast.   Yes [provider]  Multiple Vitamin (MULTIVITAMIN) tablet Take 1 tablet by mouth daily. Alive   Yes [provider]  benzonatate (TESSALON) 100 MG capsule Take 1 capsule (100 mg  total) by mouth 3 (three) times daily as needed for cough. Patient not taking: Reported on 02/14/2017 12/29/16   Ward, Ozella Almond, [place]    Physical Exam: Vitals:   02/14/17 2145 02/14/17 2245 02/14/17 2315 02/15/17 0000  BP: (!) 151/95 (!) 152/76 (!) 128/94 122/76  Pulse: (!) 102 98 90 91  Resp: 18   (!) 29  Temp:      TempSrc:      SpO2: 97% 95% 96% 96%  Weight:      Height:          Constitutional: NAD, calm, comfortable Eyes: PERTLA, lids and conjunctivae normal ENMT: Mucous membranes are moist. Posterior pharynx clear of any exudate or lesions.   Neck: normal, supple, no masses, no thyromegaly Respiratory: clear to auscultation bilaterally, no wheezing, no crackles. Mild dyspnea with speech. No accessory muscle use.  Cardiovascular: Rate ~110 and regular. No significant JVD. Abdomen: No distension, no tenderness, no masses palpated. Bowel sounds normal.  Musculoskeletal: no clubbing / cyanosis. No joint deformity upper and lower extremities.   Skin: no significant rashes, lesions, ulcers. Warm, dry, well-perfused. Neurologic: CN 2-12 grossly intact. Sensation intact. Strength 5/5 in all 4 limbs.  Psychiatric: Alert and oriented x 3. Pleasant and cooperative.     Labs on Admission: I have personally reviewed following labs and imaging studies  CBC:  Recent Labs Lab 02/14/17 1732  WBC 8.9  HGB 7.8*  HCT 25.6*  MCV 70.7*  PLT 476*   Basic Metabolic Panel:  Recent Labs Lab 02/14/17 1732  NA 136  K 3.7  CL 105  CO2 21*  GLUCOSE 110*  BUN 8  CREATININE 0.74  CALCIUM 8.6*   GFR: Estimated Creatinine Clearance: 109.4 mL/min (by C-G formula based on SCr of 0.74 mg/dL). Liver Function Tests: No results for input(s): AST, ALT, ALKPHOS, BILITOT, PROT, ALBUMIN in the last 168 hours. No results for input(s): LIPASE, AMYLASE in the last 168 hours. No results for input(s): AMMONIA in the last 168 hours. Coagulation Profile: No results for input(s): INR, PROTIME  in the last 168 hours. Cardiac Enzymes: No results for input(s): CKTOTAL, CKMB, CKMBINDEX, TROPONINI in the last 168 hours. BNP (last 3 results) No results for input(s): PROBNP in the last 8760 hours. HbA1C: No results for input(s): HGBA1C in the last 72 hours. CBG: No results for input(s): GLUCAP in the last 168 hours. Lipid Profile: No results for input(s): CHOL, HDL, LDLCALC, TRIG, CHOLHDL, LDLDIRECT in the last 72 hours. Thyroid Function Tests: No results for input(s): TSH, T4TOTAL, FREET4, T3FREE, THYROIDAB in the last 72 hours. Anemia Panel:  Recent Labs  02/14/17 2208  VITAMINB12 339  FOLATE 16.8  FERRITIN 22  TIBC 374  IRON 11*  RETICCTPCT 1.1   Urine analysis: No results found for: COLORURINE, APPEARANCEUR, LABSPEC, PHURINE, GLUCOSEU, HGBUR, BILIRUBINUR, KETONESUR, PROTEINUR, UROBILINOGEN, NITRITE, LEUKOCYTESUR Sepsis Labs: @LABRCNTIP (procalcitonin:4,lacticidven:4) )No results found for this or any previous visit (from the past 240 hour(s)).   Radiological Exams on Admission: Dg Chest 2 View  Result Date: 02/14/2017 CLINICAL DATA:  Dyspnea and productive cough with mid right-sided chest pain and chills x1 month. EXAM: CHEST  2 VIEW COMPARISON:  None.  FINDINGS: Heart is top-normal in size. No aortic aneurysm or mediastinal widening. Mild central vascular congestion is seen with small left effusion and trace fluid in the right major fissure no acute pneumonic consolidation. No pneumothorax. No acute nor suspicious osseous abnormalities. IMPRESSION: Mild central vascular congestion with fluid in the right major fissure and at the left lung base. No acute pneumonic consolidation. Electronically Signed   By: Ashley Royalty M.D.   On: 02/14/2017 18:23   Ct Angio Chest Pe W And/or Wo Contrast  Result Date: 02/15/2017 CLINICAL DATA:  41 y/o F; shortness of breath. PE suspected, high pretest probability. EXAM: CT ANGIOGRAPHY CHEST WITH CONTRAST TECHNIQUE: Multidetector CT imaging  of the chest was performed using the standard protocol during bolus administration of intravenous contrast. Multiplanar CT image reconstructions and MIPs were obtained to evaluate the vascular anatomy. CONTRAST:  100 cc Isovue 370 COMPARISON:  None. FINDINGS: Cardiovascular: Large right-sided pulmonary embolus involving right main pulmonary artery, right lower lobe pulmonary artery, multiple segmental pulmonary arteries. RV/LV equals 0.8. Normal heart size.  No pericardial.  Normal caliber thoracic aorta. Mediastinum/Nodes: No enlarged mediastinal, hilar, or axillary lymph nodes. Thyroid gland, trachea, and esophagus demonstrate no significant findings. Lungs/Pleura: Small bilateral pleural effusions. Peripheral consolidation in the right upper lobe may represent a pulmonary infarction (series 8, image 111). Upper Abdomen: No acute abnormality. Musculoskeletal: No chest wall abnormality. No acute or significant osseous findings. Review of the MIP images confirms the above findings. IMPRESSION: 1. Large right-sided acute pulmonary embolus. No findings of right heart strain. 2. Small right upper lobe peripheral consolidation may represent pulmonary infarction. 3. Small bilateral pleural effusions. These results were called by telephone at the time of interpretation on 02/15/2017 at 12:38 am to Dr. Varney Biles , who verbally acknowledged these results. Electronically Signed   By: Kristine Garbe M.D.   On: 02/15/2017 00:41    EKG: Independently reviewed. Sinus tachycardia (rate 107).   Assessment/Plan  1. Pulmonary embolism - Pt presents with 1 month of pleuritic pain and DOE  - Found to have large right-sided PE without CT-evidence for right heart-strain  - Initial troponin undetectable; no hypotension, no hypoxia  - She was started on IV heparin infusion  - Plan to continue cardiac monitoring, obtain serial troponin measurements, check echocardiogram and LE dopplers, continue heparin infusion  with close monitoring of H&H   2. Microcytic anemia, occult GI bleeding  - Pt was noted to have a microcytic anemia recently at urgent care, Hgb 8 at that time and she started iron tabs  - She denies abdominal pain, indigestion, N/V, or melena or hematochezia; she acknowledges heavy periods  - DRE without melena or gross blood, but occult blood positive  - GI is consulting and much appreciated  - She is on heparin gtt as above and will be given Protonix 40 mg IV q12h pending GI recommendations  - Anemia panel is pending; repeat CBC in am     DVT prophylaxis: IV heparin infusion Code Status: Full  Family Communication: Discussed with patient Disposition Plan: Admit to telemetry Consults called: Gastroenterology Admission status: Inpatient    Vianne Bulls, MD [facility] Pager [phone]  If 7PM-7AM, please contact night-coverage [email or web address] Password [email or web address]  02/15/2017, 1:53 AM

## 2017-02-15 NOTE — Progress Notes (Signed)
ANTICOAGULATION CONSULT NOTE - Follow Up Consult  Pharmacy Consult for Heparin Indication: pulmonary embolus  No Known Allergies  Patient Measurements: Height: 5\' 4"  (162.6 cm) Weight: 224 lb 12.8 oz (102 kg) IBW/kg (Calculated) : 54.7 Heparin Dosing Weight:  78.5 kg  Vital Signs: Temp: 98.2 F (36.8 C) (09/30 1541) Temp Source: Oral (09/30 1541) BP: 124/61 (09/30 1541) Pulse Rate: 93 (09/30 1541)  Labs:  Recent Labs  02/14/17 1732 02/15/17 0149 02/15/17 0734 02/15/17 1324 02/15/17 1923  HGB 7.8*  --   --   --   --   HCT 25.6*  --   --   --   --   PLT 546*  --   --   --   --   HEPARINUNFRC  --   --  0.31 0.18* 0.97*  CREATININE 0.74 0.76  --   --   --   TROPONINI  --  <0.03 <0.03 <0.03  --     Estimated Creatinine Clearance: 110.8 mL/min (by C-G formula based on SCr of 0.76 mg/dL).  Assessment:  Anticoag: On heparin gtt for PE. Initial level just barely therapeutic, 0.31 >0.18>0.97 (only 3.5 hr level, bolus effect). Hemoccult positive - monitor Hgb closely.  Goal of Therapy:  Heparin level 0.3-0.7 units/ml Monitor platelets by anticoagulation protocol: Yes   Plan:  Continue IV heparin at 1700 units/hr Recheck heparin level in another 3 hrs. Daily HL and CBC Monitor Hgb, s/sx bleeding closely  Alesana Magistro S. Alford Highland, PharmD, BCPS Clinical Staff Pharmacist Pager [phone]  Eilene Ghazi Stillinger 02/15/2017,7:57 PM

## 2017-02-15 NOTE — Progress Notes (Signed)
ANTICOAGULATION CONSULT NOTE - Initial Consult  Pharmacy Consult for Heparin Indication: pulmonary embolus  No Known Allergies  Patient Measurements: Height: 5\' 4"  (162.6 cm) Weight: 224 lb 12.8 oz (102 kg) IBW/kg (Calculated) : 54.7 Heparin Dosing Weight: 80 kg  Vital Signs: Temp: 98.5 F (36.9 C) (09/30 0524) Temp Source: Oral (09/30 0524) BP: 116/60 (09/30 0524) Pulse Rate: 80 (09/30 0524)  Labs:  Recent Labs  02/14/17 1732 02/15/17 0149 02/15/17 0734 02/15/17 1324  HGB 7.8*  --   --   --   HCT 25.6*  --   --   --   PLT 546*  --   --   --   HEPARINUNFRC  --   --  0.31 0.18*  CREATININE 0.74 0.76  --   --   TROPONINI  --  <0.03 <0.03  --     Estimated Creatinine Clearance: 110.8 mL/min (by C-G formula based on SCr of 0.76 mg/dL).   Medical History: Past Medical History:  Diagnosis Date  . Acute meniscal tear, lateral RIGHT  . Knee swelling, right     Medications:  No current facility-administered medications on file prior to encounter.    Current Outpatient Prescriptions on File Prior to Encounter  Medication Sig Dispense Refill  . benzonatate (TESSALON) 100 MG capsule Take 1 capsule (100 mg total) by mouth 3 (three) times daily as needed for cough. (Patient not taking: Reported on 02/14/2017) 21 capsule 0     Assessment: 77 yoF with large right-sided PE, pharmacy consulted for IV heparin.   Admission Hgb low 7.8, pltc 546. DRE without melena or gross blood, but occult blood positive. Denies abdominal pain, indigestion, N/V, melena, or hematochezia; acknowledges heavy periods. GI consulted, on protonix 40mg  IV q12h.  Initial heparin level just therapeutic at 0.31, confirmatory level below goal at 0.18.  Goal of Therapy:  Heparin level 0.3-0.7 units/ml Monitor platelets by anticoagulation protocol: Yes   Plan:  Re-bolus heparin 4000 units IV Increase heparin gtt to 1700 units/hr Recheck heparin level in 6 hours Daily heparin level and CBC while on  heparin Monitor closely for s/sx of bleeding   Kofi Murrell N. Gerarda Fraction, PharmD PGY1 Pharmacy Resident Pager: [phone]

## 2017-02-15 NOTE — Progress Notes (Signed)
ANTICOAGULATION CONSULT NOTE - Initial Consult  Pharmacy Consult for Heparin Indication: pulmonary embolus  No Known Allergies  Patient Measurements: Height: 5\' 4"  (162.6 cm) Weight: 220 lb (99.8 kg) IBW/kg (Calculated) : 54.7 Heparin Dosing Weight: 80 kg  Vital Signs: Temp: 98.4 F (36.9 C) (09/29 1725) Temp Source: Oral (09/29 1725) BP: 122/76 (09/30 0000) Pulse Rate: 91 (09/30 0000)  Labs:  Recent Labs  02/14/17 1732  HGB 7.8*  HCT 25.6*  PLT 546*  CREATININE 0.74    Estimated Creatinine Clearance: 109.4 mL/min (by C-G formula based on SCr of 0.74 mg/dL).   Medical History: Past Medical History:  Diagnosis Date  . Acute meniscal tear, lateral RIGHT  . Knee swelling, right     Medications:  No current facility-administered medications on file prior to encounter.    Current Outpatient Prescriptions on File Prior to Encounter  Medication Sig Dispense Refill  . benzonatate (TESSALON) 100 MG capsule Take 1 capsule (100 mg total) by mouth 3 (three) times daily as needed for cough. (Patient not taking: Reported on 02/14/2017) 21 capsule 0     Assessment: 41 y.o. female with PE for heparin Goal of Therapy:  Heparin level 0.3-0.7 units/ml Monitor platelets by anticoagulation protocol: Yes   Plan:  Heparin 4000 units IV bolus, then start heparin 1400 units/hr Check heparin level in 6 hours.   Sarah Ayers, Bronson Curb 02/15/2017,12:55 AM

## 2017-02-16 ENCOUNTER — Inpatient Hospital Stay (HOSPITAL_COMMUNITY): Payer: BLUE CROSS/BLUE SHIELD

## 2017-02-16 DIAGNOSIS — I361 Nonrheumatic tricuspid (valve) insufficiency: Secondary | ICD-10-CM

## 2017-02-16 DIAGNOSIS — D509 Iron deficiency anemia, unspecified: Secondary | ICD-10-CM

## 2017-02-16 DIAGNOSIS — D649 Anemia, unspecified: Secondary | ICD-10-CM

## 2017-02-16 DIAGNOSIS — I2699 Other pulmonary embolism without acute cor pulmonale: Principal | ICD-10-CM

## 2017-02-16 LAB — ECHOCARDIOGRAM COMPLETE
Height: 64 in
Weight: 3480 oz

## 2017-02-16 LAB — HEPARIN LEVEL (UNFRACTIONATED)
Heparin Unfractionated: 0.85 IU/mL — ABNORMAL HIGH (ref 0.30–0.70)
Heparin Unfractionated: 0.4 IU/mL (ref 0.30–0.70)

## 2017-02-16 LAB — CBC
HCT: 25.5 % — ABNORMAL LOW (ref 36.0–46.0)
Hemoglobin: 7.4 g/dL — ABNORMAL LOW (ref 12.0–15.0)
MCH: 20.6 pg — ABNORMAL LOW (ref 26.0–34.0)
MCHC: 29 g/dL — ABNORMAL LOW (ref 30.0–36.0)
MCV: 71 fL — ABNORMAL LOW (ref 78.0–100.0)
Platelets: 650 10*3/uL — ABNORMAL HIGH (ref 150–400)
RBC: 3.59 MIL/uL — ABNORMAL LOW (ref 3.87–5.11)
RDW: 19.2 % — ABNORMAL HIGH (ref 11.5–15.5)
WBC: 8.8 10*3/uL (ref 4.0–10.5)

## 2017-02-16 LAB — OCCULT BLOOD, POC DEVICE: Fecal Occult Bld: POSITIVE — AB

## 2017-02-16 MED ORDER — SENNOSIDES-DOCUSATE SODIUM 8.6-50 MG PO TABS
2.0000 | ORAL_TABLET | Freq: Two times a day (BID) | ORAL | Status: DC
  Administered 2017-02-16 (×2): 2 via ORAL
  Filled 2017-02-16 (×3): qty 2

## 2017-02-16 MED ORDER — POLYETHYLENE GLYCOL 3350 17 G PO PACK
17.0000 g | PACK | Freq: Every day | ORAL | Status: DC
  Administered 2017-02-16: 17 g via ORAL
  Filled 2017-02-16 (×2): qty 1

## 2017-02-16 NOTE — Progress Notes (Signed)
ANTICOAGULATION CONSULT NOTE - Follow Up Consult  Pharmacy Consult for Heparin Indication: pulmonary embolus  No Known Allergies  Patient Measurements: Height: 5\' 4"  (162.6 cm) Weight: 217 lb 8 oz (98.7 kg) IBW/kg (Calculated) : 54.7 Heparin Dosing Weight:  78.5 kg  Vital Signs: Temp: 97.4 F (36.3 C) (10/01 0500) Temp Source: Oral (10/01 0500) BP: 111/60 (10/01 0500) Pulse Rate: 60 (10/01 0500)  Labs:  Recent Labs  02/14/17 1732 02/15/17 0149  02/15/17 0734 02/15/17 1324 02/15/17 1923 02/15/17 2331 02/16/17 0639  HGB 7.8*  --   --   --   --   --   --  7.4*  HCT 25.6*  --   --   --   --   --   --  25.5*  PLT 546*  --   --   --   --   --   --  650*  HEPARINUNFRC  --   --   < > 0.31 0.18* 0.97* 0.76* 0.85*  CREATININE 0.74 0.76  --   --   --   --   --   --   TROPONINI  --  <0.03  --  <0.03 <0.03  --   --   --   < > = values in this interval not displayed.  Estimated Creatinine Clearance: 108.8 mL/min (by C-G formula based on SCr of 0.76 mg/dL).  Assessment:  Anticoag: On heparin gtt for PE. Heparin level this morning has trended up even after heparin rate decrease. No problems with IV line per RN. Hgb down slightly to 7.4. Was Hemoccult positive - monitor Hgb closely.  Goal of Therapy:  Heparin level 0.3-0.7 units/ml Monitor platelets by anticoagulation protocol: Yes   Plan:  Decrease IV heparin to 1400 units/hr Recheck heparin level in 6 hrs. Daily HL and CBC Monitor Hgb, s/sx bleeding closely   Thank you for allowing [place] to participate in this patients care.  Jens Som, PharmD Clinical phone for 02/16/2017 from 7a-3:30p: [phone] If after 3:30p, please call main pharmacy at: [phone] 02/16/2017 12:09 PM

## 2017-02-16 NOTE — Progress Notes (Signed)
ANTICOAGULATION CONSULT NOTE - Follow Up Consult  Pharmacy Consult for Heparin Indication: pulmonary embolus  No Known Allergies  Patient Measurements: Height: 5\' 4"  (162.6 cm) Weight: 217 lb 8 oz (98.7 kg) IBW/kg (Calculated) : 54.7 Heparin Dosing Weight:  78.5 kg  Vital Signs: Temp: 98 F (36.7 C) (10/01 1330) Temp Source: Oral (10/01 1330) BP: 120/61 (10/01 1330) Pulse Rate: 90 (10/01 1330)  Labs:  Recent Labs  02/14/17 1732 02/15/17 0149  02/15/17 0734 02/15/17 1324  02/15/17 2331 02/16/17 0639 02/16/17 1757  HGB 7.8*  --   --   --   --   --   --  7.4*  --   HCT 25.6*  --   --   --   --   --   --  25.5*  --   PLT 546*  --   --   --   --   --   --  650*  --   HEPARINUNFRC  --   --   < > 0.31 0.18*  < > 0.76* 0.85* 0.40  CREATININE 0.74 0.76  --   --   --   --   --   --   --   TROPONINI  --  <0.03  --  <0.03 <0.03  --   --   --   --   < > = values in this interval not displayed.  Estimated Creatinine Clearance: 108.8 mL/min (by C-G formula based on SCr of 0.76 mg/dL).  Assessment:  Anticoag: On heparin gtt for PE. Heparin level this morning had been trended up despite heparin rate decrease. CBC low but stable -was Hemoccult positive so will continue to monitor Hgb closely. No plans per GI consult to pursue endoscopic evaluation at this time. Heparin level this evening came back at 0.40, within goal range. No signs/symptoms of bleeding per nursing.  Goal of Therapy:  Heparin level 0.3-0.7 units/ml Monitor platelets by anticoagulation protocol: Yes   Plan:  Continue IV heparin at 1400 units/hr Daily HL and CBC Monitor Hgb, s/sx bleeding closely   Thank you for allowing [place] to participate in this patients care.  Doylene Canard, PharmD Clinical Pharmacist  Phone: [phone] 02/16/2017 7:18 PM

## 2017-02-16 NOTE — Progress Notes (Signed)
ANTICOAGULATION CONSULT NOTE  Pharmacy Consult for Heparin Indication: pulmonary embolus  No Known Allergies  Patient Measurements: Height: 5\' 4"  (162.6 cm) Weight: 224 lb 12.8 oz (102 kg) IBW/kg (Calculated) : 54.7 Heparin Dosing Weight: 80 kg  Vital Signs: Temp: 98.9 F (37.2 C) (09/30 2104) Temp Source: Oral (09/30 2104) BP: 116/66 (09/30 2104) Pulse Rate: 75 (09/30 2104)  Labs:  Recent Labs  02/14/17 1732 02/15/17 0149  02/15/17 0734 02/15/17 1324 02/15/17 1923 02/15/17 2331  HGB 7.8*  --   --   --   --   --   --   HCT 25.6*  --   --   --   --   --   --   PLT 546*  --   --   --   --   --   --   HEPARINUNFRC  --   --   < > 0.31 0.18* 0.97* 0.76*  CREATININE 0.74 0.76  --   --   --   --   --   TROPONINI  --  <0.03  --  <0.03 <0.03  --   --   < > = values in this interval not displayed.  Estimated Creatinine Clearance: 110.8 mL/min (by C-G formula based on SCr of 0.76 mg/dL).  Assessment: 41 y.o. female with PE for heparin Goal of Therapy:  Heparin level 0.3-0.7 units/ml Monitor platelets by anticoagulation protocol: Yes   Plan:  Decrease Heparin 1600 units/hr  Luisfelipe Engelstad, Bronson Curb 02/16/2017,12:14 AM

## 2017-02-16 NOTE — Progress Notes (Signed)
PROGRESS NOTE    Sarah Ayers  EQA:[id number] DOB: 09-12-1975 DOA: 02/14/2017 PCP: Patient, No Pcp Per    Brief Narrative: Sarah Ayers a 41 y.o.femalewho denies any significant past medical history, now presenting to the emergency department for evaluation of shortness of breath and chest pain. SHE WAS FOUND to have PE. She was started on IV heparin. Echocardiogram and LE duplex ordered. She was also found to have microcytic anemia, started on PPI, .  Assessment & Plan:   Principal Problem:   Pulmonary embolism (HCC) Active Problems:   Microcytic anemia   Pleuritic pain   Respiratory distress   Thrombocytosis (HCC)   Occult GI bleeding  Acute large right pulmonary embolism:  Admitted to telemetry.  Started her on heparin gtt. Echocardiogram ordered and pending.  Lower extremity duplex done showed There is subacute DVT noted in the right common femoral, proximal femoral, profunda, popliteal, posterior tibial, and peroneal veins. Transition to oral NOAC in am if hemoglobin remains stable.    Microcytic anemia:   Low iron on anemia panel.  Iron supplementation will be added. GI consulted, and plan for EGD if active bleeding.  Transfuse to keep hemoglobin greater than 7.  Resume PPI.     Thrombocytosis:  Monitor.     DVT prophylaxis: heparin gtt Code Status: full code  Family Communication: family at bedside.  Disposition Plan: pending evaluation by GI.    Consultants:   Gastroenterology     Procedures: echocardiogram.   Venous duplex.    Antimicrobials:none.    Subjective: NO CHEST PAIN OR SOB.   Objective: Vitals:   02/15/17 0524 02/15/17 1541 02/15/17 2104 02/16/17 0500  BP: 116/60 124/61 116/66 111/60  Pulse: 80 93 75 60  Resp: 20 16 18 17   Temp: 98.5 F (36.9 C) 98.2 F (36.8 C) 98.9 F (37.2 C) (!) 97.4 F (36.3 C)  TempSrc: Oral Oral Oral Oral  SpO2: 95% 100% 99% 99%  Weight:    98.7 kg (217 lb 8 oz)  Height:        Intake/Output  Summary (Last 24 hours) at 02/16/17 1439 Last data filed at 02/15/17 1540  Gross per 24 hour  Intake              420 ml  Output                1 ml  Net              419 ml   Filed Weights   02/14/17 1725 02/15/17 0415 02/16/17 0500  Weight: 99.8 kg (220 lb) 102 kg (224 lb 12.8 oz) 98.7 kg (217 lb 8 oz)    Examination:  General exam: Appears calm and comfortable  Respiratory system: Clear to auscultation. Respiratory effort normal. Cardiovascular system: S1 & S2 heard, RRR. No JVD, murmurs, rubs, gallops or clicks. No pedal edema. Gastrointestinal system: Abdomen is nondistended, soft and nontender. No organomegaly or masses felt. Normal bowel sounds heard. Central nervous system: Alert and oriented. No focal neurological deficits. Extremities: Symmetric 5 x 5 power. Skin: No rashes, lesions or ulcers Psychiatry: Judgement and insight appear normal. Mood & affect appropriate.     Data Reviewed: I have personally reviewed following labs and imaging studies  CBC:  Recent Labs Lab 02/14/17 1732 02/16/17 0639  WBC 8.9 8.8  HGB 7.8* 7.4*  HCT 25.6* 25.5*  MCV 70.7* 71.0*  PLT 546* 979*   Basic Metabolic Panel:  Recent Labs Lab 02/14/17 1732 02/15/17 0149  NA 136 136  K 3.7 3.6  CL 105 107  CO2 21* 22  GLUCOSE 110* 96  BUN 8 6  CREATININE 0.74 0.76  CALCIUM 8.6* 8.5*   GFR: Estimated Creatinine Clearance: 108.8 mL/min (by C-G formula based on SCr of 0.76 mg/dL). Liver Function Tests: No results for input(s): AST, ALT, ALKPHOS, BILITOT, PROT, ALBUMIN in the last 168 hours. No results for input(s): LIPASE, AMYLASE in the last 168 hours. No results for input(s): AMMONIA in the last 168 hours. Coagulation Profile: No results for input(s): INR, PROTIME in the last 168 hours. Cardiac Enzymes:  Recent Labs Lab 02/15/17 0149 02/15/17 0734 02/15/17 1324  TROPONINI <0.03 <0.03 <0.03   BNP (last 3 results) No results for input(s): PROBNP in the last 8760  hours. HbA1C: No results for input(s): HGBA1C in the last 72 hours. CBG: No results for input(s): GLUCAP in the last 168 hours. Lipid Profile: No results for input(s): CHOL, HDL, LDLCALC, TRIG, CHOLHDL, LDLDIRECT in the last 72 hours. Thyroid Function Tests: No results for input(s): TSH, T4TOTAL, FREET4, T3FREE, THYROIDAB in the last 72 hours. Anemia Panel:  Recent Labs  02/14/17 2208  VITAMINB12 339  FOLATE 16.8  FERRITIN 22  TIBC 374  IRON 11*  RETICCTPCT 1.1   Sepsis Labs: No results for input(s): PROCALCITON, LATICACIDVEN in the last 168 hours.  No results found for this or any previous visit (from the past 240 hour(s)).       Radiology Studies: Dg Chest 2 View  Result Date: 02/14/2017 CLINICAL DATA:  Dyspnea and productive cough with mid right-sided chest pain and chills x1 month. EXAM: CHEST  2 VIEW COMPARISON:  None. FINDINGS: Heart is top-normal in size. No aortic aneurysm or mediastinal widening. Mild central vascular congestion is seen with small left effusion and trace fluid in the right major fissure no acute pneumonic consolidation. No pneumothorax. No acute nor suspicious osseous abnormalities. IMPRESSION: Mild central vascular congestion with fluid in the right major fissure and at the left lung base. No acute pneumonic consolidation. Electronically Signed   By: Ashley Royalty M.D.   On: 02/14/2017 18:23   Ct Angio Chest Pe W And/or Wo Contrast  Result Date: 02/15/2017 CLINICAL DATA:  41 y/o F; shortness of breath. PE suspected, high pretest probability. EXAM: CT ANGIOGRAPHY CHEST WITH CONTRAST TECHNIQUE: Multidetector CT imaging of the chest was performed using the standard protocol during bolus administration of intravenous contrast. Multiplanar CT image reconstructions and MIPs were obtained to evaluate the vascular anatomy. CONTRAST:  100 cc Isovue 370 COMPARISON:  None. FINDINGS: Cardiovascular: Large right-sided pulmonary embolus involving right main pulmonary  artery, right lower lobe pulmonary artery, multiple segmental pulmonary arteries. RV/LV equals 0.8. Normal heart size.  No pericardial.  Normal caliber thoracic aorta. Mediastinum/Nodes: No enlarged mediastinal, hilar, or axillary lymph nodes. Thyroid gland, trachea, and esophagus demonstrate no significant findings. Lungs/Pleura: Small bilateral pleural effusions. Peripheral consolidation in the right upper lobe may represent a pulmonary infarction (series 8, image 111). Upper Abdomen: No acute abnormality. Musculoskeletal: No chest wall abnormality. No acute or significant osseous findings. Review of the MIP images confirms the above findings. IMPRESSION: 1. Large right-sided acute pulmonary embolus. No findings of right heart strain. 2. Small right upper lobe peripheral consolidation may represent pulmonary infarction. 3. Small bilateral pleural effusions. These results were called by telephone at the time of interpretation on 02/15/2017 at 12:38 am to Dr. Varney Biles , who verbally acknowledged these results. Electronically Signed   By: Mia Creek  [place] M.D.   On: 02/15/2017 00:41        Scheduled Meds: . multivitamin with minerals  1 tablet Oral Daily  . pantoprazole (PROTONIX) IV  40 mg Intravenous Q12H  . polyethylene glycol  17 g Oral Daily  . senna-docusate  2 tablet Oral BID  . sodium chloride flush  3 mL Intravenous Q12H   Continuous Infusions: . heparin 1,400 Units/hr (02/16/17 1229)     LOS: 1 day    Time spent: 35 minutes.     Hosie Poisson, MD [facility] Pager [phone]  If 7PM-7AM, please contact night-coverage [email or web address] Password [email or web address] 02/16/2017, 2:39 PM

## 2017-02-16 NOTE — Consult Note (Signed)
[facility] Consultation Note  Referring Provider: Hosie Poisson, MD [facility]) Primary Care Physician:  Patient, No Pcp Per  Reason for Consultation:  Anemia, hemoccult-positive stool  HPI: Sarah Ayers is a 41 y.o. female with recent diagnosis anemia, presenting to ED with worsening dyspnea.  Found to have pulmonary embolism, placed on heparin.  No nausea, vomiting, dysphagia, abdominal pain, change in bowel habits, blood in stool, loss-of-appetite, unintentional weight loss. No prior endoscopy or colonoscopy.   Past Medical History:  Diagnosis Date  . Acute meniscal tear, lateral RIGHT  . Knee swelling, right     Past Surgical History:  Procedure Laterality Date  . MENISECTOMY  10/03/2011   Procedure: MENISECTOMY;  Surgeon: Magnus Sinning, MD;  Location: [facility];  Service: Orthopedics;  Laterality: Right;   arthroscopic partial lateral menisectomy  . WISDOM TOOTH EXTRACTION  AGE 18   ORAL SURGEON OFFICE    Prior to Admission medications   Medication Sig Start Date End Date Taking? Authorizing Provider  azithromycin (ZITHROMAX) 250 MG tablet Take 250 mg by mouth daily. 02/10/17  Yes [provider]  ferrous sulfate 325 (65 FE) MG tablet Take 325 mg by mouth daily with breakfast.   Yes [provider]  Multiple Vitamin (MULTIVITAMIN) tablet Take 1 tablet by mouth daily. Alive   Yes [provider]  benzonatate (TESSALON) 100 MG capsule Take 1 capsule (100 mg total) by mouth 3 (three) times daily as needed for cough. Patient not taking: Reported on 02/14/2017 12/29/16   Ward, Ozella Almond, PA-C    Current Facility-Administered Medications  Medication Dose Route Frequency Provider Last Rate Last Dose  . acetaminophen (TYLENOL) tablet 650 mg  650 mg Oral Q6H PRN Opyd, Ilene Qua, MD       Or  . acetaminophen (TYLENOL) suppository 650 mg  650 mg Rectal Q6H PRN Opyd, Ilene Qua, MD      . heparin ADULT infusion 100 units/mL (25000  units/240mL sodium chloride 0.45%)  1,400 Units/hr Intravenous Continuous Hosie Poisson, MD 14 mL/hr at 02/16/17 1229 1,400 Units/hr at 02/16/17 1229  . HYDROcodone-acetaminophen (NORCO/VICODIN) 5-325 MG per tablet 1-2 tablet  1-2 tablet Oral Q4H PRN Opyd, Ilene Qua, MD   2 tablet at 02/15/17 2151  . multivitamin with minerals tablet 1 tablet  1 tablet Oral Daily Opyd, Ilene Qua, MD   1 tablet at 02/16/17 1015  . ondansetron (ZOFRAN) tablet 4 mg  4 mg Oral Q6H PRN Opyd, Ilene Qua, MD       Or  . ondansetron (ZOFRAN) injection 4 mg  4 mg Intravenous Q6H PRN Opyd, Ilene Qua, MD      . pantoprazole (PROTONIX) injection 40 mg  40 mg Intravenous Q12H Opyd, Ilene Qua, MD   40 mg at 02/16/17 1016  . polyethylene glycol (MIRALAX / GLYCOLAX) packet 17 g  17 g Oral Daily Hosie Poisson, MD      . senna-docusate (Senokot-S) tablet 2 tablet  2 tablet Oral BID Hosie Poisson, MD      . sodium chloride flush (NS) 0.9 % injection 3 mL  3 mL Intravenous Q12H Opyd, Ilene Qua, MD   3 mL at 02/16/17 1016    Allergies as of 02/14/2017  . (No Known Allergies)    Family History  Problem Relation Age of Onset  . Bleeding Disorder Maternal Grandmother   . Deep vein thrombosis Neg Hx   . Pulmonary embolism Neg Hx     Social History   Social History  .  Marital status: Single    Spouse name: N/A  . Number of children: N/A  . Years of education: N/A   Occupational History  . Not on file.   Social History Main Topics  . Smoking status: Current Every Day Smoker    Packs/day: 0.25    Years: 3.00  . Smokeless tobacco: Never Used  . Alcohol use Yes     Comment: OCCASIONAL  . Drug use: No  . Sexual activity: Not on file   Other Topics Concern  . Not on file   Social History Narrative  . No narrative on file    Review of Systems: As per HPI, all others negative  Physical Exam: Vital signs in last 24 hours: Temp:  [97.4 F (36.3 C)-98.9 F (37.2 C)] 97.4 F (36.3 C) (10/01 0500) Pulse Rate:   [60-93] 60 (10/01 0500) Resp:  [16-18] 17 (10/01 0500) BP: (111-124)/(60-66) 111/60 (10/01 0500) SpO2:  [99 %-100 %] 99 % (10/01 0500) Weight:  [217 lb 8 oz (98.7 kg)] 217 lb 8 oz (98.7 kg) (10/01 0500) Last BM Date: 02/15/17 General:   Alert,  overweight, well-nourished, pleasant and cooperative in NAD Head:  Normocephalic and atraumatic. Eyes:  Sclera clear, no icterus.   Conjunctiva pink. Ears:  Normal auditory acuity. Nose:  No deformity, discharge,  or lesions. Mouth:  No deformity or lesions.  Oropharynx pink & moist. Neck:  Thick but supple; no masses or thyromegaly. Abdomen:  Soft, nontender and nondistended. No masses, hepatosplenomegaly or hernias noted. Normal bowel sounds, without guarding, and without rebound.     Msk:  Symmetrical without gross deformities. Normal posture. Pulses:  Normal pulses noted. Extremities:  Without clubbing or edema. Neurologic:  Alert and  oriented x4; grossly normal neurologically. Skin:  Intact without significant lesions or rashes. Cervical Nodes:  No significant cervical adenopathy. Psych:  Alert and cooperative. Normal mood and affect.   Lab Results:  Recent Labs  02/14/17 1732 02/16/17 0639  WBC 8.9 8.8  HGB 7.8* 7.4*  HCT 25.6* 25.5*  PLT 546* 650*   BMET  Recent Labs  02/14/17 1732 02/15/17 0149  NA 136 136  K 3.7 3.6  CL 105 107  CO2 21* 22  GLUCOSE 110* 96  BUN 8 6  CREATININE 0.74 0.76  CALCIUM 8.6* 8.5*   LFT No results for input(s): PROT, ALBUMIN, AST, ALT, ALKPHOS, BILITOT, BILIDIR, IBILI in the last 72 hours. PT/INR No results for input(s): LABPROT, INR in the last 72 hours.  Studies/Results: Dg Chest 2 View  Result Date: 02/14/2017 CLINICAL DATA:  Dyspnea and productive cough with mid right-sided chest pain and chills x1 month. EXAM: CHEST  2 VIEW COMPARISON:  None. FINDINGS: Heart is top-normal in size. No aortic aneurysm or mediastinal widening. Mild central vascular congestion is seen with small left  effusion and trace fluid in the right major fissure no acute pneumonic consolidation. No pneumothorax. No acute nor suspicious osseous abnormalities. IMPRESSION: Mild central vascular congestion with fluid in the right major fissure and at the left lung base. No acute pneumonic consolidation. Electronically Signed   By: Ashley Royalty M.D.   On: 02/14/2017 18:23   Ct Angio Chest Pe W And/or Wo Contrast  Result Date: 02/15/2017 CLINICAL DATA:  41 y/o F; shortness of breath. PE suspected, high pretest probability. EXAM: CT ANGIOGRAPHY CHEST WITH CONTRAST TECHNIQUE: Multidetector CT imaging of the chest was performed using the standard protocol during bolus administration of intravenous contrast. Multiplanar CT image reconstructions and MIPs  were obtained to evaluate the vascular anatomy. CONTRAST:  100 cc Isovue 370 COMPARISON:  None. FINDINGS: Cardiovascular: Large right-sided pulmonary embolus involving right main pulmonary artery, right lower lobe pulmonary artery, multiple segmental pulmonary arteries. RV/LV equals 0.8. Normal heart size.  No pericardial.  Normal caliber thoracic aorta. Mediastinum/Nodes: No enlarged mediastinal, hilar, or axillary lymph nodes. Thyroid gland, trachea, and esophagus demonstrate no significant findings. Lungs/Pleura: Small bilateral pleural effusions. Peripheral consolidation in the right upper lobe may represent a pulmonary infarction (series 8, image 111). Upper Abdomen: No acute abnormality. Musculoskeletal: No chest wall abnormality. No acute or significant osseous findings. Review of the MIP images confirms the above findings. IMPRESSION: 1. Large right-sided acute pulmonary embolus. No findings of right heart strain. 2. Small right upper lobe peripheral consolidation may represent pulmonary infarction. 3. Small bilateral pleural effusions. These results were called by telephone at the time of interpretation on 02/15/2017 at 12:38 am to Dr. Varney Biles , who verbally  acknowledged these results. Electronically Signed   By: Kristine Garbe M.D.   On: 02/15/2017 00:41   Impression:  1.  Microcytic anemia.  No overt GI bleeding.  Suspect from menorrhagia. 2.  Hemoccult positive stools. 3.  Acute pulmonary embolism.  Plan:  1.  PPI so long as patient remains on anticoagulation. 2.  Anticoagulation for pulmonary embolism. 3.  Would not pursue endoscopic evaluation unless patient has overt GI bleeding.  Otherwise, would consider elective endoscopic evaluation for anemia down-the-road. 4.  Will sign-off; please call with questions.   LOS: 1 day   Lylianna Fraiser M  02/16/2017, 1:10 PM  Cell [phone] If no answer or after 5 PM call [phone]

## 2017-02-16 NOTE — Progress Notes (Signed)
  Echocardiogram 2D Echocardiogram has been performed.  Sarah Ayers 02/16/2017, 3:52 PM

## 2017-02-17 LAB — CBC
HCT: 26.2 % — ABNORMAL LOW (ref 36.0–46.0)
Hemoglobin: 7.4 g/dL — ABNORMAL LOW (ref 12.0–15.0)
MCH: 20.1 pg — ABNORMAL LOW (ref 26.0–34.0)
MCHC: 28.2 g/dL — ABNORMAL LOW (ref 30.0–36.0)
MCV: 71.2 fL — ABNORMAL LOW (ref 78.0–100.0)
Platelets: 739 10*3/uL — ABNORMAL HIGH (ref 150–400)
RBC: 3.68 MIL/uL — ABNORMAL LOW (ref 3.87–5.11)
RDW: 19.2 % — ABNORMAL HIGH (ref 11.5–15.5)
WBC: 8.6 10*3/uL (ref 4.0–10.5)

## 2017-02-17 LAB — CREATININE, SERUM
Creatinine, Ser: 0.78 mg/dL (ref 0.44–1.00)
GFR calc Af Amer: >60 mL/min (ref >60)
GFR calc non Af Amer: >60 mL/min (ref >60)

## 2017-02-17 LAB — PREPARE RBC (CROSSMATCH)

## 2017-02-17 LAB — HEPARIN LEVEL (UNFRACTIONATED): Heparin Unfractionated: 0.61 IU/mL (ref 0.30–0.70)

## 2017-02-17 MED ORDER — SODIUM CHLORIDE 0.9 % IV SOLN
Freq: Once | INTRAVENOUS | Status: AC
  Administered 2017-02-17: 14:00:00 via INTRAVENOUS

## 2017-02-17 MED ORDER — RIVAROXABAN 15 MG PO TABS: 15.0000 mg | ORAL_TABLET | Freq: Two times a day (BID) | ORAL | 0 refills | Status: DC

## 2017-02-17 MED ORDER — PANTOPRAZOLE SODIUM 40 MG PO TBEC: 40.0000 mg | DELAYED_RELEASE_TABLET | Freq: Every day | ORAL | 0 refills | Status: AC

## 2017-02-17 MED ORDER — RIVAROXABAN 15 MG PO TABS
15.0000 mg | ORAL_TABLET | Freq: Two times a day (BID) | ORAL | Status: DC
  Administered 2017-02-17 (×2): 15 mg via ORAL
  Filled 2017-02-17 (×2): qty 1

## 2017-02-17 MED ORDER — RIVAROXABAN 20 MG PO TABS: 20.0000 mg | ORAL_TABLET | Freq: Every day | ORAL | 1 refills | Status: AC

## 2017-02-17 MED ORDER — POLYETHYLENE GLYCOL 3350 17 G PO PACK: 17.0000 g | PACK | Freq: Every day | ORAL | 0 refills | Status: AC | PRN

## 2017-02-17 MED ORDER — RIVAROXABAN 20 MG PO TABS: 20.0000 mg | ORAL_TABLET | Freq: Every day | ORAL | Status: DC

## 2017-02-17 NOTE — Discharge Instructions (Signed)
Information on my medicine - XARELTO (rivaroxaban)  This medication education was reviewed with me or my healthcare representative as part of my discharge preparation.    WHY WAS XARELTO PRESCRIBED FOR YOU? Xarelto was prescribed to treat blood clots that may have been found in the veins of your legs (deep vein thrombosis) or in your lungs (pulmonary embolism) and to reduce the risk of them occurring again.  What do you need to know about Xarelto? The starting dose is one 15 mg tablet taken TWICE daily with food for the FIRST 21 DAYS then on (enter date)  ***  the dose is changed to one 20 mg tablet taken ONCE A DAY with your evening meal.  DO NOT stop taking Xarelto without talking to the health care provider who prescribed the medication.  Refill your prescription for 20 mg tablets before you run out.  After discharge, you should have regular check-up appointments with your healthcare provider that is prescribing your Xarelto.  In the future your dose may need to be changed if your kidney function changes by a significant amount.  What do you do if you miss a dose? If you are taking Xarelto TWICE DAILY and you miss a dose, take it as soon as you remember. You may take two 15 mg tablets (total 30 mg) at the same time then resume your regularly scheduled 15 mg twice daily the next day.  If you are taking Xarelto ONCE DAILY and you miss a dose, take it as soon as you remember on the same day then continue your regularly scheduled once daily regimen the next day. Do not take two doses of Xarelto at the same time.   Important Safety Information Xarelto is a blood thinner medicine that can cause bleeding. You should call your healthcare provider right away if you experience any of the following: ? Bleeding from an injury or your nose that does not stop. ? Unusual colored urine (red or dark brown) or unusual colored stools (red or black). ? Unusual bruising for unknown reasons. ? A  serious fall or if you hit your head (even if there is no bleeding).  Some medicines may interact with Xarelto and might increase your risk of bleeding while on Xarelto. To help avoid this, consult your healthcare provider or pharmacist prior to using any new prescription or non-prescription medications, including herbals, vitamins, non-steroidal anti-inflammatory drugs (NSAIDs) and supplements.  This website has more information on Xarelto: www.xarelto.com. 

## 2017-02-17 NOTE — Progress Notes (Signed)
CM received consult:NEEDS PCP. Pt with health insurance, pt can call [phone] on back of insurance card to help navigate PCP. CM to share information with pt and provide pt with [facility] information. Whitman Hero RN,BSN,CM

## 2017-02-17 NOTE — Progress Notes (Addendum)
ANTICOAGULATION CONSULT NOTE - Follow Up Consult  Pharmacy Consult for Heparin>>Xarelto Indication: pulmonary embolus  No Known Allergies  Patient Measurements: Height: 5\' 4"  (162.6 cm) Weight: 188 lb 14.4 oz (85.7 kg) IBW/kg (Calculated) : 54.7 Heparin Dosing Weight:  78.5 kg  Vital Signs: Temp: 98.5 F (36.9 C) (10/02 0609) Temp Source: Oral (10/02 0609) BP: 112/70 (10/02 0609) Pulse Rate: 74 (10/02 0609)  Labs:  Recent Labs  02/14/17 1732 02/15/17 0149  02/15/17 0734 02/15/17 1324  02/16/17 0639 02/16/17 1757 02/17/17 0623  HGB 7.8*  --   --   --   --   --  7.4*  --  7.4*  HCT 25.6*  --   --   --   --   --  25.5*  --  26.2*  PLT 546*  --   --   --   --   --  650*  --  739*  HEPARINUNFRC  --   --   < > 0.31 0.18*  < > 0.85* 0.40 0.61  CREATININE 0.74 0.76  --   --   --   --   --   --   --   TROPONINI  --  <0.03  --  <0.03 <0.03  --   --   --   --   < > = values in this interval not displayed.  Estimated Creatinine Clearance: 101 mL/min (by C-G formula based on SCr of 0.76 mg/dL).  Assessment:  Anticoag: On heparin gtt for PE. Doppler also positive for subacute DVT noted in the right common femoral, proximal femoral, profunda, popliteal, posterior tibial, and peroneal veins. Now transitioning to Xarelto. No signs/symptoms of bleeding noted, renal function has been stable.  Goal of Therapy:  Anticoagulation Monitor platelets by anticoagulation protocol: Yes   Plan:  Discontinue heparin Start Xarelto 15 mg PO BID with meals x 21 days, followed by 20 mg PO daily with supper Monitor renal function and for signs and symptoms of bleeding   Thank you for allowing [place] to participate in this patients care.  Jens Som, PharmD Clinical phone for 02/17/2017 from 7a-3:30p: [phone] If after 3:30p, please call main pharmacy at: [phone] 02/17/2017 11:12 AM

## 2017-02-18 LAB — TYPE AND SCREEN
ABO/RH(D): B POS
Antibody Screen: NEGATIVE
Unit division: 0

## 2017-02-18 LAB — BPAM RBC
Blood Product Expiration Date: 201810062359
ISSUE DATE / TIME: 201810021427
Unit Type and Rh: 7300

## 2017-02-18 NOTE — Discharge Summary (Signed)
Physician Discharge Summary  Sarah Ayers WUX:[id number] DOB: 01-Sep-1975 DOA: 02/14/2017  PCP: Patient, No Pcp Per  Admit date: 02/14/2017 Discharge date: 02/17/2017  Admitted From: Home.  Disposition:  Home.  Recommendations for Outpatient Follow-up:  1. Follow up with PCP in 1-2 weeks 2. Please obtain BMP/CBC in one week Please follow up with gastroenterologist in one week.  Please follow up with hematology as needed.   Discharge Condition:stable.  CODE STATUS: full code.  Diet recommendation: Heart Healthy   Brief/Interim Summary: Sarah Ayers a 42 y.o.femalewho denies any significant past medical history, now presenting to the emergency department for evaluation of shortness of breath and chest pain. SHE WAS FOUND to have PE. She was started on IV heparin. Echocardiogram and LE duplex ordered. She was also found to have microcytic anemia, started on PPI, .  Discharge Diagnoses:  Principal Problem:   Pulmonary embolism (HCC) Active Problems:   Microcytic anemia   Pleuritic pain   Respiratory distress   Thrombocytosis (HCC)   Occult GI bleeding   Acute large right pulmonary embolism:  Admitted to telemetry.  Started her on heparin gtt. Echocardiogram ordered and reviewed.  Lower extremity duplex done showed There is subacute DVT noted in the right common femoral, proximal femoral, profunda, popliteal, posterior tibial, and peroneal veins. Transition to oral NOAC  On discharge as hemoglobin is stable.    Microcytic anemia:   Low iron on anemia panel.  Iron supplementation will be added. GI consulted, and plan for EGD if active bleeding.  One unit of prbc transfusion ordered as her hemoglobin is around 7.4 and she reports she has a h/o heavy menstrual bleeding and she is being started on anti coagulation.  Resume PPI. Follow up with gastroenterology in 2 weeks to scheduled EGD and also follow up with OB/GYN for heavy menstrual bleeding.      Thrombocytosis:   Monitor. Recheck cbc in one week.     Discharge Instructions  Discharge Instructions    Diet - low sodium heart healthy    Complete by:  As directed    Discharge instructions    Complete by:  As directed    Please follow up with PCP in one week.  Please follow up with gastroenterology as outpatient in 1 to 2 weeks.     Allergies as of 02/17/2017   No Known Allergies     Medication List    STOP taking these medications   azithromycin 250 MG tablet Commonly known as:  ZITHROMAX   benzonatate 100 MG capsule Commonly known as:  TESSALON     TAKE these medications   ferrous sulfate 325 (65 FE) MG tablet Take 325 mg by mouth daily with breakfast.   multivitamin tablet Take 1 tablet by mouth daily. Alive   pantoprazole 40 MG tablet Commonly known as:  PROTONIX Take 1 tablet (40 mg total) by mouth daily.   polyethylene glycol packet Commonly known as:  MIRALAX / GLYCOLAX Take 17 g by mouth daily as needed.   Rivaroxaban 15 MG Tabs tablet Commonly known as:  XARELTO Take 1 tablet (15 mg total) by mouth 2 (two) times daily with a meal.   rivaroxaban 20 MG Tabs tablet Commonly known as:  XARELTO Take 1 tablet (20 mg total) by mouth daily with supper. Start taking on:  03/10/2017      Follow-up Information    Arta Silence, MD. Schedule an appointment as soon as possible for a visit in 2 week(s).   Specialty:  Gastroenterology  Why:  for EGD  Contact information: [address] [place] [address] [phone]          No Known Allergies  Consultations:  Gastroenterology.    Procedures/Studies: Dg Chest 2 View  Result Date: 02/14/2017 CLINICAL DATA:  Dyspnea and productive cough with mid right-sided chest pain and chills x1 month. EXAM: CHEST  2 VIEW COMPARISON:  None. FINDINGS: Heart is top-normal in size. No aortic aneurysm or mediastinal widening. Mild central vascular congestion is seen with small left effusion and trace fluid  in the right major fissure no acute pneumonic consolidation. No pneumothorax. No acute nor suspicious osseous abnormalities. IMPRESSION: Mild central vascular congestion with fluid in the right major fissure and at the left lung base. No acute pneumonic consolidation. Electronically Signed   By: Ashley Royalty M.D.   On: 02/14/2017 18:23   Ct Angio Chest Pe W And/or Wo Contrast  Result Date: 02/15/2017 CLINICAL DATA:  41 y/o F; shortness of breath. PE suspected, high pretest probability. EXAM: CT ANGIOGRAPHY CHEST WITH CONTRAST TECHNIQUE: Multidetector CT imaging of the chest was performed using the standard protocol during bolus administration of intravenous contrast. Multiplanar CT image reconstructions and MIPs were obtained to evaluate the vascular anatomy. CONTRAST:  100 cc Isovue 370 COMPARISON:  None. FINDINGS: Cardiovascular: Large right-sided pulmonary embolus involving right main pulmonary artery, right lower lobe pulmonary artery, multiple segmental pulmonary arteries. RV/LV equals 0.8. Normal heart size.  No pericardial.  Normal caliber thoracic aorta. Mediastinum/Nodes: No enlarged mediastinal, hilar, or axillary lymph nodes. Thyroid gland, trachea, and esophagus demonstrate no significant findings. Lungs/Pleura: Small bilateral pleural effusions. Peripheral consolidation in the right upper lobe may represent a pulmonary infarction (series 8, image 111). Upper Abdomen: No acute abnormality. Musculoskeletal: No chest wall abnormality. No acute or significant osseous findings. Review of the MIP images confirms the above findings. IMPRESSION: 1. Large right-sided acute pulmonary embolus. No findings of right heart strain. 2. Small right upper lobe peripheral consolidation may represent pulmonary infarction. 3. Small bilateral pleural effusions. These results were called by telephone at the time of interpretation on 02/15/2017 at 12:38 am to Dr. Varney Biles , who verbally acknowledged these results.  Electronically Signed   By: Kristine Garbe M.D.   On: 02/15/2017 00:41       Subjective:  No new complaints.  Discharge Exam: Vitals:   02/17/17 1456 02/17/17 1806  BP: 107/60 134/71  Pulse: 81   Resp: 16 16  Temp: 98.7 F (37.1 C) 97.8 F (36.6 C)  SpO2: 100%    Vitals:   02/17/17 0609 02/17/17 1417 02/17/17 1456 02/17/17 1806  BP: 112/70 107/74 107/60 134/71  Pulse: 74 84 81   Resp: 17 16 16 16   Temp: 98.5 F (36.9 C) 98.5 F (36.9 C) 98.7 F (37.1 C) 97.8 F (36.6 C)  TempSrc: Oral Oral Oral Oral  SpO2: 98% 100% 100%   Weight:      Height:        General: Pt is alert, awake, not in acute distress Cardiovascular: RRR, S1/S2 +, no rubs, no gallops Respiratory: CTA bilaterally, no wheezing, no rhonchi Abdominal: Soft, NT, ND, bowel sounds + Extremities: no edema, no cyanosis    The results of significant diagnostics from this hospitalization (including imaging, microbiology, ancillary and laboratory) are listed below for reference.     Microbiology: No results found for this or any previous visit (from the past 240 hour(s)).   Labs: BNP (last 3 results) No results  for input(s): BNP in the last 8760 hours. Basic Metabolic Panel:  Recent Labs Lab 02/14/17 1732 02/15/17 0149 02/17/17 1128  NA 136 136  --   K 3.7 3.6  --   CL 105 107  --   CO2 21* 22  --   GLUCOSE 110* 96  --   BUN 8 6  --   CREATININE 0.74 0.76 0.78  CALCIUM 8.6* 8.5*  --    Liver Function Tests: No results for input(s): AST, ALT, ALKPHOS, BILITOT, PROT, ALBUMIN in the last 168 hours. No results for input(s): LIPASE, AMYLASE in the last 168 hours. No results for input(s): AMMONIA in the last 168 hours. CBC:  Recent Labs Lab 02/14/17 1732 02/16/17 0639 02/17/17 0623  WBC 8.9 8.8 8.6  HGB 7.8* 7.4* 7.4*  HCT 25.6* 25.5* 26.2*  MCV 70.7* 71.0* 71.2*  PLT 546* 650* 739*   Cardiac Enzymes:  Recent Labs Lab 02/15/17 0149 02/15/17 0734 02/15/17 1324   TROPONINI <0.03 <0.03 <0.03   BNP: Invalid input(s): POCBNP CBG: No results for input(s): GLUCAP in the last 168 hours. D-Dimer No results for input(s): DDIMER in the last 72 hours. Hgb A1c No results for input(s): HGBA1C in the last 72 hours. Lipid Profile No results for input(s): CHOL, HDL, LDLCALC, TRIG, CHOLHDL, LDLDIRECT in the last 72 hours. Thyroid function studies No results for input(s): TSH, T4TOTAL, T3FREE, THYROIDAB in the last 72 hours.  Invalid input(s): FREET3 Anemia work up No results for input(s): VITAMINB12, FOLATE, FERRITIN, TIBC, IRON, RETICCTPCT in the last 72 hours. Urinalysis No results found for: COLORURINE, APPEARANCEUR, LABSPEC, [place], GLUCOSEU, HGBUR, BILIRUBINUR, KETONESUR, PROTEINUR, UROBILINOGEN, NITRITE, LEUKOCYTESUR Sepsis Labs Invalid input(s): PROCALCITONIN,  WBC,  LACTICIDVEN Microbiology No results found for this or any previous visit (from the past 240 hour(s)).   Time coordinating discharge: Over 30 minutes  SIGNED:   Hosie Poisson, MD  [facility] 02/18/2017, 8:24 AM Pager   If 7PM-7AM, please contact night-coverage [email or web address] Password [email or web address]

## 2017-02-26 DIAGNOSIS — Z124 Encounter for screening for malignant neoplasm of cervix: Secondary | ICD-10-CM | POA: Diagnosis not present

## 2017-02-26 DIAGNOSIS — I2699 Other pulmonary embolism without acute cor pulmonale: Secondary | ICD-10-CM | POA: Diagnosis not present

## 2017-02-26 DIAGNOSIS — R87615 Unsatisfactory cytologic smear of cervix: Secondary | ICD-10-CM | POA: Diagnosis not present

## 2017-02-26 DIAGNOSIS — Z6838 Body mass index (BMI) 38.0-38.9, adult: Secondary | ICD-10-CM | POA: Diagnosis not present

## 2017-02-26 DIAGNOSIS — N939 Abnormal uterine and vaginal bleeding, unspecified: Secondary | ICD-10-CM | POA: Diagnosis not present

## 2017-03-02 DIAGNOSIS — Z86711 Personal history of pulmonary embolism: Secondary | ICD-10-CM | POA: Diagnosis not present

## 2017-03-02 DIAGNOSIS — N920 Excessive and frequent menstruation with regular cycle: Secondary | ICD-10-CM | POA: Diagnosis not present

## 2017-03-02 DIAGNOSIS — D259 Leiomyoma of uterus, unspecified: Secondary | ICD-10-CM | POA: Diagnosis not present

## 2017-03-04 DIAGNOSIS — Z0000 Encounter for general adult medical examination without abnormal findings: Secondary | ICD-10-CM | POA: Diagnosis not present

## 2017-03-04 DIAGNOSIS — Z86718 Personal history of other venous thrombosis and embolism: Secondary | ICD-10-CM | POA: Diagnosis not present

## 2017-03-04 DIAGNOSIS — Z86711 Personal history of pulmonary embolism: Secondary | ICD-10-CM | POA: Diagnosis not present

## 2017-03-04 DIAGNOSIS — D509 Iron deficiency anemia, unspecified: Secondary | ICD-10-CM | POA: Diagnosis not present

## 2017-03-04 DIAGNOSIS — Z13220 Encounter for screening for lipoid disorders: Secondary | ICD-10-CM | POA: Diagnosis not present

## 2017-03-04 DIAGNOSIS — Z8349 Family history of other endocrine, nutritional and metabolic diseases: Secondary | ICD-10-CM | POA: Diagnosis not present

## 2017-03-10 ENCOUNTER — Telehealth: Payer: Self-pay | Admitting: Hematology and Oncology

## 2017-03-10 NOTE — Telephone Encounter (Signed)
Appt has been scheduled for the pt to see Dr. Lebron Conners on 10/25 at 8am. Pt aware to arrive 15 minutes early. Location given.

## 2017-03-12 ENCOUNTER — Encounter: Payer: BLUE CROSS/BLUE SHIELD | Admitting: Hematology and Oncology

## 2017-03-16 DIAGNOSIS — Z86711 Personal history of pulmonary embolism: Secondary | ICD-10-CM | POA: Diagnosis not present

## 2017-03-16 DIAGNOSIS — D509 Iron deficiency anemia, unspecified: Secondary | ICD-10-CM | POA: Diagnosis not present

## 2017-03-20 ENCOUNTER — Ambulatory Visit (HOSPITAL_BASED_OUTPATIENT_CLINIC_OR_DEPARTMENT_OTHER): Payer: BLUE CROSS/BLUE SHIELD | Admitting: Hematology and Oncology

## 2017-03-20 ENCOUNTER — Telehealth: Payer: Self-pay | Admitting: Hematology and Oncology

## 2017-03-20 ENCOUNTER — Encounter: Payer: Self-pay | Admitting: Hematology and Oncology

## 2017-03-20 ENCOUNTER — Ambulatory Visit (HOSPITAL_BASED_OUTPATIENT_CLINIC_OR_DEPARTMENT_OTHER): Payer: BLUE CROSS/BLUE SHIELD

## 2017-03-20 VITALS — BP 141/81 | HR 69 | Temp 98.9°F | Resp 18 | Ht 64.0 in | Wt 228.6 lb

## 2017-03-20 DIAGNOSIS — I82402 Acute embolism and thrombosis of unspecified deep veins of left lower extremity: Secondary | ICD-10-CM

## 2017-03-20 DIAGNOSIS — D500 Iron deficiency anemia secondary to blood loss (chronic): Secondary | ICD-10-CM | POA: Diagnosis not present

## 2017-03-20 DIAGNOSIS — D75839 Thrombocytosis, unspecified: Secondary | ICD-10-CM

## 2017-03-20 DIAGNOSIS — D473 Essential (hemorrhagic) thrombocythemia: Secondary | ICD-10-CM | POA: Diagnosis not present

## 2017-03-20 DIAGNOSIS — Z832 Family history of diseases of the blood and blood-forming organs and certain disorders involving the immune mechanism: Secondary | ICD-10-CM | POA: Diagnosis not present

## 2017-03-20 DIAGNOSIS — I2699 Other pulmonary embolism without acute cor pulmonale: Secondary | ICD-10-CM

## 2017-03-20 LAB — LACTATE DEHYDROGENASE: LDH: 172 U/L (ref 125–245)

## 2017-03-20 LAB — COMPREHENSIVE METABOLIC PANEL
ALT: 10 U/L (ref 0–55)
AST: 13 U/L (ref 5–34)
Albumin: 3.4 g/dL — ABNORMAL LOW (ref 3.5–5.0)
Alkaline Phosphatase: 51 U/L (ref 40–150)
Anion Gap: 7 mEq/L (ref 3–11)
BUN: 8.7 mg/dL (ref 7.0–26.0)
CO2: 23 mEq/L (ref 22–29)
Calcium: 8.9 mg/dL (ref 8.4–10.4)
Chloride: 108 mEq/L (ref 98–109)
Creatinine: 0.8 mg/dL (ref 0.6–1.1)
EGFR: >60 mL/min/{1.73_m2} (ref >60)
Glucose: 82 mg/dl (ref 70–140)
Potassium: 3.6 mEq/L (ref 3.5–5.1)
Sodium: 139 mEq/L (ref 136–145)
Total Bilirubin: 0.85 mg/dL (ref 0.20–1.20)
Total Protein: 7.2 g/dL (ref 6.4–8.3)

## 2017-03-20 LAB — MORPHOLOGY: PLT EST: 308

## 2017-03-20 LAB — PROTIME-INR
INR: 0.9 — ABNORMAL LOW (ref 2.00–3.50)
Protime: 10.8 Seconds (ref 10.6–13.4)

## 2017-03-20 LAB — CBC & DIFF AND RETIC
BASO%: 0.7 % (ref 0.0–2.0)
Basophils Absolute: 0 10*3/uL (ref 0.0–0.1)
EOS%: 3.1 % (ref 0.0–7.0)
Eosinophils Absolute: 0.1 10*3/uL (ref 0.0–0.5)
HCT: 36.2 % (ref 34.8–46.6)
HGB: 11 g/dL — ABNORMAL LOW (ref 11.6–15.9)
Immature Retic Fract: 5 % (ref 1.60–10.00)
LYMPH%: 45.1 % (ref 14.0–49.7)
MCH: 24.6 pg — ABNORMAL LOW (ref 25.1–34.0)
MCHC: 30.4 g/dL — ABNORMAL LOW (ref 31.5–36.0)
MCV: 80.8 fL (ref 79.5–101.0)
MONO#: 0.2 10*3/uL (ref 0.1–0.9)
MONO%: 5.4 % (ref 0.0–14.0)
NEUT#: 2 10*3/uL (ref 1.5–6.5)
NEUT%: 45.7 % (ref 38.4–76.8)
Platelets: 319 10*3/uL (ref 145–400)
RBC: 4.48 10*6/uL (ref 3.70–5.45)
RDW: 23.8 % — ABNORMAL HIGH (ref 11.2–14.5)
Retic %: 1.57 % (ref 0.70–2.10)
Retic Ct Abs: 70.34 10*3/uL (ref 33.70–90.70)
WBC: 4.3 10*3/uL (ref 3.9–10.3)
lymph#: 1.9 10*3/uL (ref 0.9–3.3)

## 2017-03-20 NOTE — Telephone Encounter (Signed)
Gave avs and calendar for November  °

## 2017-03-20 NOTE — Patient Instructions (Signed)
Thank you for choosing [facility] to provide your oncology and hematology care.  To afford each patient quality time with our providers, please arrive 30 minutes before your scheduled appointment time.  If you arrive late for your appointment, you may be asked to reschedule.  We strive to give you quality time with our providers, and arriving late affects you and other patients whose appointments are after yours.   If you are a no show for multiple scheduled visits, you may be dismissed from the clinic at the providers discretion.    Again, thank you for choosing [facility], our hope is that these requests will decrease the amount of time that you wait before being seen by our physicians.  ______________________________________________________________________  Should you have questions after your visit to the [facility], please contact our office at [phone] between the hours of 8:30 and 4:30 p.m.    Voicemails left after 4:30p.m will not be returned until the following business day.    For prescription refill requests, please have your pharmacy contact us directly.  Please also try to allow 48 hours for prescription requests.    Please contact the scheduling department for questions regarding scheduling.  For scheduling of procedures such as PET scans, CT scans, MRI, Ultrasound, etc please contact central scheduling at [phone].    Resources For Cancer Patients and Caregivers:   Oncolink.org:  A wonderful resource for patients and healthcare providers for information regarding your disease, ways to tract your treatment, what to expect, etc.     American Cancer Society:  800-227-2345  Can help patients locate various types of support and financial assistance  Cancer Care: 1-800-813-HOPE (4673) Provides financial assistance, online support groups, medication/co-pay assistance.    [place] DSS:  336-641-3447 Where to apply for food  stamps, Medicaid, and utility assistance  Medicare Rights Center: 800-333-4114 Helps people with Medicare understand their rights and benefits, navigate the Medicare system, and secure the quality healthcare they deserve  SCAT: 336-333-6589 [employer]'s shared-ride transportation service for eligible riders who have a disability that prevents them from riding the fixed route bus.    For additional information on assistance programs please contact our social worker:   Grier Hock/Abigail Elmore:  [phone]            

## 2017-03-21 LAB — SEDIMENTATION RATE: Sedimentation Rate-Westergren: 36 mm/hr — ABNORMAL HIGH (ref 0–32)

## 2017-03-21 LAB — VITAMIN B12: Vitamin B12: 302 pg/mL (ref 232–1245)

## 2017-03-21 LAB — HAPTOGLOBIN: Haptoglobin: 136 mg/dL (ref 34–200)

## 2017-03-21 LAB — C-REACTIVE PROTEIN: CRP: 4.8 mg/L (ref 0.0–4.9)

## 2017-03-21 LAB — APTT: aPTT: 25 s (ref 24–33)

## 2017-03-21 LAB — FIBRINOGEN: FIBRINOGEN: 360 mg/dL (ref 193–507)

## 2017-03-21 LAB — FOLATE: Folate: >20 ng/mL (ref >3.0)

## 2017-03-23 LAB — DIRECT ANTIGLOBULIN TEST (NOT AT ARMC): Coombs', Direct: NEGATIVE

## 2017-03-23 LAB — IRON AND TIBC
%SAT: 11 % — ABNORMAL LOW (ref 21–57)
Iron: 42 ug/dL (ref 41–142)
TIBC: 398 ug/dL (ref 236–444)
UIBC: 356 ug/dL (ref 120–384)

## 2017-03-23 LAB — FERRITIN: Ferritin: 18 ng/ml (ref 9–269)

## 2017-03-27 ENCOUNTER — Encounter: Payer: Self-pay | Admitting: Hematology and Oncology

## 2017-03-27 ENCOUNTER — Telehealth: Payer: Self-pay | Admitting: Hematology and Oncology

## 2017-03-27 ENCOUNTER — Ambulatory Visit (HOSPITAL_BASED_OUTPATIENT_CLINIC_OR_DEPARTMENT_OTHER): Payer: BLUE CROSS/BLUE SHIELD | Admitting: Hematology and Oncology

## 2017-03-27 VITALS — BP 135/77 | HR 86 | Temp 97.5°F | Resp 20 | Ht 64.0 in | Wt 227.6 lb

## 2017-03-27 DIAGNOSIS — N920 Excessive and frequent menstruation with regular cycle: Secondary | ICD-10-CM

## 2017-03-27 DIAGNOSIS — D259 Leiomyoma of uterus, unspecified: Secondary | ICD-10-CM | POA: Diagnosis not present

## 2017-03-27 DIAGNOSIS — D473 Essential (hemorrhagic) thrombocythemia: Secondary | ICD-10-CM

## 2017-03-27 DIAGNOSIS — I2699 Other pulmonary embolism without acute cor pulmonale: Secondary | ICD-10-CM

## 2017-03-27 DIAGNOSIS — Z720 Tobacco use: Secondary | ICD-10-CM | POA: Diagnosis not present

## 2017-03-27 DIAGNOSIS — D75839 Thrombocytosis, unspecified: Secondary | ICD-10-CM

## 2017-03-27 DIAGNOSIS — D500 Iron deficiency anemia secondary to blood loss (chronic): Secondary | ICD-10-CM | POA: Diagnosis not present

## 2017-03-27 NOTE — Telephone Encounter (Signed)
Scheduled appt per 11/9 los - Gave patient AVS and calender per.

## 2017-04-01 DIAGNOSIS — D500 Iron deficiency anemia secondary to blood loss (chronic): Secondary | ICD-10-CM | POA: Insufficient documentation

## 2017-04-01 NOTE — Assessment & Plan Note (Signed)
41 y.o. female pulmonary embolism evaluation.  Event occurred she was diagnosed at the end of September, but symptoms appear to have started earlier and potentially as early as August 2018 without any precipitating events out of the ordinary for the patient.  She does have regular long-range travel, but it has not been longer than usual or more frequent than usual.  No history of surgery, trauma, or pregnancy.  Patient is a smoker, but does not use any oral contraceptives and denies any other changes in medications or supplements.  Patient did have a significant burden of clot without right ventricular strain as well as an extensive deep vein thrombosis in the left lower extremity.  She was treated appropriately with rivaroxaban.  Of interest, patient had stool positive for blood prior to initiation of therapeutic anticoagulation.  Additionally, on presentation to the emergency room in September she was already severely anemic with microcytic hypochromic anemia and thrombocytosis consistent with gastrointestinal blood loss. Iron panel obtained at that time supports iron deficiency.  Repeat lab work obtained several days later continued to show progressive thrombocytosis which is likely reactive. Since anticoagulation, patient has been experiencing heavy menstrual bleeding.  No other pathological bleeding sources found so far.  This creates a complicated situation with bleeding and clotting occurring at the same time with significant amount of anemia present.  It appears that the anemia actually predates the development of thrombosis with positive stool blood raising concern for possible abnormalities in the GI tract such as malignancy as a precipitating cause for the thrombosis.  We do need to assess patient for gastrointestinal abnormalities with colonoscopy and EGD based on the iron deficiency anemia present.  Interruption of therapeutic anticoagulation at this time he is also quite dangerous.  Everything  considered, we are likely able to maintain patient's anemia better and continue anticoagulation other than interrupting anticoagulation at this time and risking clot propagation.  Plan: --Labs today including iron panel, B12, and folate. --Anticoagulation:     --Reduce rivaroxaban dose to 20 mg daily  --If patient continues bleeding, will consider switching to warfarin  --EGD/colonoscopy is recommended.  Options are to it until anticoagulation has been present for at least 3 months or the alternative is placement of Infuse-a-Port or temporary protection against further embolization from the left lower extremity, interruption of therapeutic anticoagulation and conduction of colonoscopy EGD ASAP.  --Regarding menstrual bleeding, patient will likely require indefinite anticoagulation based on severity of the thrombotic event.  In this setting, direct intervention on the uterus would be indicated included but not limited to possible hysterectomy.  Once again, if surgery is planned, anticoagulation can be interrupted as long as an IVC filter is placed preemptively.

## 2017-04-01 NOTE — Progress Notes (Signed)
[facility] New Visit:  Assessment: Pulmonary embolism [facility]) 41 y.o. female pulmonary embolism evaluation.  Event occurred she was diagnosed at the end of September, but symptoms appear to have started earlier and potentially as early as August 2018 without any precipitating events out of the ordinary for the patient.  She does have regular long-range travel, but it has not been longer than usual or more frequent than usual.  No history of surgery, trauma, or pregnancy.  Patient is a smoker, but does not use any oral contraceptives and denies any other changes in medications or supplements.  Patient did have a significant burden of clot without right ventricular strain as well as an extensive deep vein thrombosis in the left lower extremity.  She was treated appropriately with rivaroxaban.  Of interest, patient had stool positive for blood prior to initiation of therapeutic anticoagulation.  Additionally, on presentation to the emergency room in September she was already severely anemic with microcytic hypochromic anemia and thrombocytosis consistent with gastrointestinal blood loss. Iron panel obtained at that time supports iron deficiency.  Repeat lab work obtained several days later continued to show progressive thrombocytosis which is likely reactive. Since anticoagulation, patient has been experiencing heavy menstrual bleeding.  No other pathological bleeding sources found so far.  This creates a complicated situation with bleeding and clotting occurring at the same time with significant amount of anemia present.  It appears that the anemia actually predates the development of thrombosis with positive stool blood raising concern for possible abnormalities in the GI tract such as malignancy as a precipitating cause for the thrombosis.  We do need to assess patient for gastrointestinal abnormalities with colonoscopy and EGD based on the iron deficiency anemia present.   Interruption of therapeutic anticoagulation at this time he is also quite dangerous.  Everything considered, we are likely able to maintain patient's anemia better and continue anticoagulation other than interrupting anticoagulation at this time and risking clot propagation.  Plan: --Labs today including iron panel, B12, and folate. --Anticoagulation:     --Reduce rivaroxaban dose to 20 mg daily  --If patient continues bleeding, will consider switching to warfarin  --EGD/colonoscopy is recommended.  Options are to it until anticoagulation has been present for at least 3 months or the alternative is placement of Infuse-a-Port or temporary protection against further embolization from the left lower extremity, interruption of therapeutic anticoagulation and conduction of colonoscopy EGD ASAP.  --Regarding menstrual bleeding, patient will likely require indefinite anticoagulation based on severity of the thrombotic event.  In this setting, direct intervention on the uterus would be indicated included but not limited to possible hysterectomy.  Once again, if surgery is planned, anticoagulation can be interrupted as long as an IVC filter is placed preemptively.  Return to clinic in 1 week to review findings  Voice recognition software was used and creation of this note. Despite my best effort at editing the text, some misspelling/errors may have occurred.  Orders Placed This Encounter  Procedures  . CBC & Diff and Retic    Standing Status:   Future    Number of Occurrences:   1    Standing Expiration Date:   03/20/2018  . Morphology    Standing Status:   Future    Number of Occurrences:   1    Standing Expiration Date:   03/20/2018  . Comprehensive metabolic panel    Standing Status:   Future    Number of Occurrences:   1  Standing Expiration Date:   03/20/2018  . Lactate dehydrogenase (LDH)    Standing Status:   Future    Number of Occurrences:   1    Standing Expiration Date:   03/20/2018   . Sedimentation rate    Standing Status:   Future    Number of Occurrences:   1    Standing Expiration Date:   03/20/2018  . C-reactive protein    Standing Status:   Future    Number of Occurrences:   1    Standing Expiration Date:   03/20/2018  . APTT    Standing Status:   Future    Number of Occurrences:   1    Standing Expiration Date:   03/20/2018  . Protime-INR    Standing Status:   Future    Number of Occurrences:   1    Standing Expiration Date:   03/20/2018  . Fibrinogen    Standing Status:   Future    Number of Occurrences:   1    Standing Expiration Date:   03/20/2018  . Ferritin    Standing Status:   Future    Number of Occurrences:   1    Standing Expiration Date:   03/20/2018  . Iron and TIBC    Standing Status:   Future    Number of Occurrences:   1    Standing Expiration Date:   03/20/2018  . Folate, Serum    Standing Status:   Future    Number of Occurrences:   1    Standing Expiration Date:   03/20/2018  . Vitamin B12    Standing Status:   Future    Number of Occurrences:   1    Standing Expiration Date:   03/20/2018  . Haptoglobin    Standing Status:   Future    Number of Occurrences:   1    Standing Expiration Date:   03/20/2018  . Direct antiglobulin test (Coombs)    Standing Status:   Future    Number of Occurrences:   1    Standing Expiration Date:   03/20/2018    All questions were answered.  . The patient knows to call the clinic with any problems, questions or concerns.  This note was electronically signed.    History of Presenting Illness Sarah Ayers 41 y.o. presenting to the [facility] for evaluation and recommendations regarding management of a recent pulmonary embolism.  Patient is referred to [place] by Dr. Katharine Look Rivard.  Please see outline of hematological history below.  Patient is currently on therapeutic anticoagulation with rivaroxaban.  Appears to be tolerating medication well with exception of heavy menstrual periods with symptomatic  anemia that has required blood transfusions in the past.  At the present time, patient denies any new symptoms.  No recurrent chest pain although patient is yet to recover fully to her preevent capacity.  Denies any active abdominal discomfort, no gross hematochezia or melena.  No hematuria.  Has hemoptysis, hematemesis, or epistaxis.  On additional questioning, patient denies significant symptoms prior to August 2018.  She does travel monthly, but has not changed her travel arrangements or frequency.  Denies any injury to the lower extremities.  Denies any recent surgery or pregnancy.  Family history is negative for malignancy.  Significant for hypertension, CVA, and coronary artery disease.  Oncological/hematological History: --Event #1, 02/14/17: Patient presented on 02/14/17 to the emergency room with pleuritic chest pain and shortness of breath present for  approximately 1 month.  On additional questioning, patient was seen in the emergency room in August and initially diagnosed with pneumonia or upper respiratory tract infection and treated with Z-Pak without relief of symptoms.  Subsequently, patient traveled to [place] in September which she does every month.  At the end of the month, she has presented to the emergency room with the above symptoms.  --Labs, 02/14/17: WBC 8.9, Hgb 7.8, MCV 70.7, MCH 21.5, RDW 19.7, Plt 546; Fe 11, FeSat 3%, TIBC 374, Ferritin 22, Folate 16.8, Vit B12 339, FOBT+  --CTA Chest, 02/15/17: Large right pulmonary embolism in the main pulmonary artery, right lower lobe artery with multisegmental embolization.  No evidence of right ventricular strain, possible right upper lobe infarction.  --Doppler US BL LE, 02/15/17: Active acute thrombus in the right common femoral, right femoral, right profunda femoris, right popliteal, right posterior tibial and right peroneal veins.  No active deep vein thrombosis in the left side.  --ECHO, 02/16/17: Normal cardiac  ultrasound.  --Labs, 02/17/17: WBC 8.6, Hgb 7.4, MCV 71.2, MCH 20.1, RDW 19.2, Plt 739;  Treatment:  --Heparin gtt, 02/14/17  --Rivaroxaban, Sep 6269-: Complicated by heavy menstrual periods.  Medical History: Past Medical History:  Diagnosis Date  . Acute meniscal tear, lateral RIGHT  . Knee swelling, right     Surgical History: Past Surgical History:  Procedure Laterality Date  . WISDOM TOOTH EXTRACTION  AGE 49   ORAL SURGEON OFFICE    Family History: Family History  Problem Relation Age of Onset  . Bleeding Disorder Maternal Grandmother   . Deep vein thrombosis Neg Hx   . Pulmonary embolism Neg Hx     Social History: Social History   Socioeconomic History  . Marital status: Single    Spouse name: Not on file  . Number of children: Not on file  . Years of education: Not on file  . Highest education level: Not on file  Social Needs  . Financial resource strain: Not on file  . Food insecurity - worry: Not on file  . Food insecurity - inability: Not on file  . Transportation needs - medical: Not on file  . Transportation needs - non-medical: Not on file  Occupational History  . Not on file  Tobacco Use  . Smoking status: Former Smoker    Packs/day: 0.25    Years: 3.00    Pack years: 0.75    Last attempt to quit: 02/16/2017    Years since quitting: 0.1  . Smokeless tobacco: Never Used  Substance and Sexual Activity  . Alcohol use: Yes    Comment: OCCASIONAL on weekends  . Drug use: No  . Sexual activity: Not on file  Other Topics Concern  . Not on file  Social History Narrative  . Not on file    Allergies: No Known Allergies  Medications:  Current Outpatient Medications  Medication Sig Dispense Refill  . ferrous sulfate 325 (65 FE) MG tablet Take 325 mg by mouth daily with breakfast.    . Multiple Vitamin (MULTIVITAMIN) tablet Take 1 tablet by mouth daily. Alive    . pantoprazole (PROTONIX) 40 MG tablet Take 1 tablet (40 mg total) by mouth daily.  30 tablet 0  . polyethylene glycol (MIRALAX / GLYCOLAX) packet Take 17 g by mouth daily as needed. 14 each 0  . rivaroxaban (XARELTO) 20 MG TABS tablet Take 1 tablet (20 mg total) by mouth daily with supper. 30 tablet 1   No current facility-administered medications for this  visit.     Review of Systems: Review of Systems  All other systems reviewed and are negative.    PHYSICAL EXAMINATION Blood pressure (!) 141/81, pulse 69, temperature 98.9 F (37.2 C), temperature source Oral, resp. rate 18, height _0  (1.626 m), weight 228 lb 9.6 oz (103.7 kg), SpO2 100 %.  ECOG PERFORMANCE STATUS: 1 - Symptomatic but completely ambulatory  Physical Exam  Constitutional: She is oriented to person, place, and time and well-developed, well-nourished, and in no distress. No distress.  HENT:  Head: Normocephalic and atraumatic.  Mouth/Throat: Oropharynx is clear and moist. No oropharyngeal exudate.  Eyes: Conjunctivae and EOM are normal. Pupils are equal, round, and reactive to light. No scleral icterus.  Neck: No thyromegaly present.  Cardiovascular: Normal rate, regular rhythm and normal heart sounds. Exam reveals no gallop.  No murmur heard. Pulmonary/Chest: Effort normal and breath sounds normal. No respiratory distress. She has no wheezes. She has no rales. She exhibits no tenderness.  Abdominal: Soft. Bowel sounds are normal. She exhibits no distension. There is no tenderness. There is no rebound.  Musculoskeletal: Normal range of motion. She exhibits no edema or tenderness.  Lymphadenopathy:    She has no cervical adenopathy.  Neurological: She is alert and oriented to person, place, and time. She has normal reflexes. No cranial nerve deficit. Coordination normal.  Skin: Skin is warm and dry. No rash noted. She is not diaphoretic. No erythema.     LABORATORY DATA: I have personally reviewed the data as listed: Appointment on 03/20/2017  Component Date Value Ref Range Status  . WBC  03/20/2017 4.3  3.9 - 10.3 10e3/uL Final  . NEUT# 03/20/2017 2.0  1.5 - 6.5 10e3/uL Final  . HGB 03/20/2017 11.0* 11.6 - 15.9 g/dL Final  . HCT 03/20/2017 36.2  34.8 - 46.6 % Final  . Platelets 03/20/2017 319  145 - 400 10e3/uL Final  . MCV 03/20/2017 80.8  79.5 - 101.0 fL Final  . MCH 03/20/2017 24.6* 25.1 - 34.0 pg Final  . MCHC 03/20/2017 30.4* 31.5 - 36.0 g/dL Final  . RBC 03/20/2017 4.48  3.70 - 5.45 10e6/uL Final  . RDW 03/20/2017 23.8* 11.2 - 14.5 % Final  . lymph# 03/20/2017 1.9  0.9 - 3.3 10e3/uL Final  . MONO# 03/20/2017 0.2  0.1 - 0.9 10e3/uL Final  . Eosinophils Absolute 03/20/2017 0.1  0.0 - 0.5 10e3/uL Final  . Basophils Absolute 03/20/2017 0.0  0.0 - 0.1 10e3/uL Final  . NEUT% 03/20/2017 45.7  38.4 - 76.8 % Final  . LYMPH% 03/20/2017 45.1  14.0 - 49.7 % Final  . MONO% 03/20/2017 5.4  0.0 - 14.0 % Final  . EOS% 03/20/2017 3.1  0.0 - 7.0 % Final  . BASO% 03/20/2017 0.7  0.0 - 2.0 % Final  . Retic % 03/20/2017 1.57  0.70 - 2.10 % Final  . Retic Ct Abs 03/20/2017 70.34  33.70 - 90.70 10e3/uL Final  . Immature Retic Fract 03/20/2017 5.00  1.60 - 10.00 % Final  . Tear Drop Cells 03/20/2017 Few  Negative Final  . Ovalocytes 03/20/2017 Few  Negative Final  . Shistocytes 03/20/2017 Few  Negative Final  . White Cell Comments 03/20/2017 C/W auto diff   Final  . PLT EST 03/20/2017 308  Adequate Final  . Platelet Morphology 03/20/2017 Large Platelets  Within Normal Limits Final  . Sodium 03/20/2017 139  136 - 145 mEq/L Final  . Potassium 03/20/2017 3.6  3.5 - 5.1 mEq/L Final  .  Chloride 03/20/2017 108  98 - 109 mEq/L Final  . CO2 03/20/2017 23  22 - 29 mEq/L Final  . Glucose 03/20/2017 82  70 - 140 mg/dl Final   Glucose reference range is for nonfasting patients. Fasting glucose reference range is 70- 100.  Marland Kitchen BUN 03/20/2017 8.7  7.0 - 26.0 mg/dL Final  . Creatinine 03/20/2017 0.8  0.6 - 1.1 mg/dL Final  . Total Bilirubin 03/20/2017 0.85  0.20 - 1.20 mg/dL Final  . Alkaline  Phosphatase 03/20/2017 51  40 - 150 U/L Final  . AST 03/20/2017 13  5 - 34 U/L Final  . ALT 03/20/2017 10  0 - 55 U/L Final  . Total Protein 03/20/2017 7.2  6.4 - 8.3 g/dL Final  . Albumin 03/20/2017 3.4* 3.5 - 5.0 g/dL Final  . Calcium 03/20/2017 8.9  8.4 - 10.4 mg/dL Final  . Anion Gap 03/20/2017 7  3 - 11 mEq/L Final  . EGFR 03/20/2017 >60  >60 ml/min/1.73 m2 Final   eGFR is calculated using the CKD-EPI Creatinine Equation (2009)  . LDH 03/20/2017 172  125 - 245 U/L Final  . Sedimentation Rate-Westergren 03/20/2017 36* 0 - 32 mm/hr Final  . CRP 03/20/2017 4.8  0.0 - 4.9 mg/L Final  . aPTT 03/20/2017 25  24 - 33 sec Final   Comment: This test has not been validated for monitoring unfractionated heparin therapy. aPTT-based therapeutic ranges for unfractionated heparin therapy have not been established. For general guidelines on Heparin monitoring, refer to the [employer].   . Protime 03/20/2017 10.8  10.6 - 13.4 Seconds Final  . INR 03/20/2017 0.90* 2.00 - 3.50 Final   Comment: INR is useful only to assess adequacy of anticoagulation with coumadin when comparing results from different labs. It should not be used to estimate bleeding risk or presence/abscense of coagulopathy in patients not on coumadin. Expected INR ranges for  nontherapeutic patients is 0.88 - 1.12.   Marland Kitchen Lovenox 03/20/2017 No   Final  . FIBRINOGEN 03/20/2017 360  193 - 507 mg/dL Final  . Ferritin 03/20/2017 18  9 - 269 ng/ml Final  . Iron 03/20/2017 42  41 - 142 ug/dL Final  . TIBC 03/20/2017 398  236 - 444 ug/dL Final  . UIBC 03/20/2017 356  120 - 384 ug/dL Final  . %SAT 03/20/2017 11* 21 - 57 % Final  . Folate 03/20/2017 >20.0  >3.0 ng/mL Final   Comment: A serum folate concentration of less than 3.1 ng/mL is considered to represent clinical deficiency.   . Vitamin B12 03/20/2017 302  232 - 1,245 pg/mL Final  . Haptoglobin 03/20/2017 136  34 - 200 mg/dL Final  . Coombs', Direct 03/20/2017  Negative  Negative Final         Ardath Sax, MD

## 2017-04-05 NOTE — Progress Notes (Signed)
Sarah Ayers:  Assessment: Pulmonary embolism Sarah Ayers) 41 y.o. female pulmonary embolism evaluation.  She was diagnosed at the end of Sep 2018, but symptoms appear to have started earlier and potentially as early as Aug 2018 without any precipitating events out of the ordinary for the patient.  She does have regular long-range travel, but it has not been longer than usual or more frequent than usual.  No history of surgery, trauma, or pregnancy.  Patient is a smoker, but does not use any oral contraceptives and denies any other changes in medications or supplements. Patient did have a significant burden of clot without right ventricular strain as well as an extensive deep vein thrombosis in the left lower extremity.  She was treated appropriately with rivaroxaban.  Of interest, patient had stool positive for blood prior to initiation of therapeutic anticoagulation.  Additionally, on presentation to the emergency room in September she was already severely anemic with microcytic hypochromic anemia and thrombocytosis consistent with gastrointestinal blood loss. Iron panel obtained at that time supports iron deficiency.  Repeat lab work obtained several days later continued to show progressive thrombocytosis which is likely reactive. Since anticoagulation, patient has been experiencing heavy menstrual bleeding.  No other pathological bleeding sources found so far. This creates a complicated situation with bleeding and clotting occurring at the same time with significant amount of anemia present.  It appears that the anemia actually predates the development of thrombosis with positive stool blood raising concern for possible abnormalities in the GI tract such as malignancy as a precipitating cause for the thrombosis.  We do need to assess patient for gastrointestinal abnormalities with colonoscopy and EGD based on the iron deficiency anemia present.  Interruption of therapeutic  anticoagulation at this time he is also quite dangerous.  Everything considered, we are likely able to maintain patient's anemia better and continue anticoagulation other than interrupting anticoagulation at this time and risking clot propagation.  Blood work demonstrates stable hemoglobin of 11.0 with resolution of interval thrombocytosis down to normal platelet values consistent with improving iron deficiency and likely cessation of gastrointestinal blood loss.  Plan: --Continue iron supplementation by mouth --Anticoagulation:     --Continue rivaroxaban 20 mg daily  --EGD/colonoscopy is recommended.  Options are to it until anticoagulation has been present for at least 3 months or the alternative is placement of Infuse-a-Port or temporary protection against further embolization from the left lower extremity, interruption of therapeutic anticoagulation and conduction of colonoscopy EGD ASAP.  --Regarding menstrual bleeding, patient will likely require indefinite anticoagulation based on severity of the thrombotic event.  In this setting, direct intervention on the uterus would be indicated included but not limited to possible hysterectomy.  Once again, if surgery is planned, anticoagulation can be interrupted as long as an IVC filter is placed preemptively. --Return to my clinic in 3 months for hematological monitoring, or sooner if new symptoms appear  Voice recognition software was used and creation of this note. Despite my best effort at editing the text, some misspelling/errors may have occurred.  Orders Placed This Encounter  Procedures  . CBC with Differential    Standing Status:   Future    Standing Expiration Date:   03/27/2018  . Comprehensive metabolic panel    Standing Status:   Future    Standing Expiration Date:   03/27/2018  . Ferritin    Standing Status:   Future    Standing Expiration Date:   03/27/2018  . Iron and  TIBC    Standing Status:   Future    Standing Expiration  Date:   03/27/2018    Cancer Staging No matching staging information was found for the patient.  All questions were answered.  . The patient knows to call the clinic with any problems, questions or concerns.  This note was electronically signed.    History of Presenting Illness Sarah Ayers is a 41 y.o. female followed in the [place] for evaluation and recommendations regarding management of a recent pulmonary embolism.  Patient is referred to [place] by Sarah Ayers.  Please see outline of hematological history below.  Patient is currently on therapeutic anticoagulation with rivaroxaban.  Appears to be tolerating medication well with exception of heavy menstrual periods with symptomatic anemia that has required blood transfusions in the past.  At the present time, patient denies any new symptoms.  No recurrent chest pain although patient is yet to recover fully to her preevent capacity.  Denies any active abdominal discomfort, no gross hematochezia or melena.  No hematuria.  Has hemoptysis, hematemesis, or epistaxis.  On additional questioning, patient denies significant symptoms prior to August 2018.  She does travel monthly, but has not changed her travel arrangements or frequency.  Denies any injury to the lower extremities.  Denies any recent surgery or pregnancy.  Family history is negative for malignancy.  Significant for hypertension, CVA, and coronary artery disease.  Oncological/hematological History: --Event #1, 02/14/17: Patient presented on 02/14/17 to the emergency room with pleuritic chest pain and shortness of breath present for approximately 1 month.  On additional questioning, patient was seen in the emergency room in August and initially diagnosed with pneumonia or upper respiratory tract infection and treated with Z-Pak without relief of symptoms.  Subsequently, patient traveled to [place] in September which she does every month.  At the end of the month, she has  presented to the emergency room with the above symptoms.             --Labs, 02/14/17: WBC 8.9, Hgb 7.8, MCV 70.7, MCH 21.5, RDW 19.7, Plt 546; Fe 11, FeSat 3%, TIBC 374, Ferritin 22, Folate 16.8, Vit B12 339, FOBT+             --CTA Chest, 02/15/17: Large right pulmonary embolism in the main pulmonary artery, right lower lobe artery with multisegmental embolization.  No evidence of right ventricular strain, possible right upper lobe infarction.             --Doppler US BL LE, 02/15/17: Active acute thrombus in the right common femoral, right femoral, right profunda femoris, right popliteal, right posterior tibial and right peroneal veins.  No active deep vein thrombosis in the left side.             --ECHO, 02/16/17: Normal cardiac ultrasound.             --Labs, 02/17/17: WBC 8.6, Hgb 7.4, MCV 71.2, MCH 20.1, RDW 19.2, Plt 739;  Treatment:             --Heparin gtt, 02/14/17             --Rivaroxaban, Sep 5465-: Complicated by heavy menstrual periods. --Labs, 03/20/17: WBC 4.3, Hgb 11.0, MCV 80.8, MCH 24.6, RDW 23.8, Plt 319; Fe 42, FeSat 11%, TIBC 398, Ferritin 18, Folate >20.0, Vit B12 302; Haptoglobin 136, LDH 172, DAT -- negative;    Medical History: Past Medical History:  Diagnosis Date  . Acute meniscal tear, lateral RIGHT  . Knee  swelling, right     Surgical History: Past Surgical History:  Procedure Laterality Date  . KNEE ARTHROSCOPY WITH MEDIAL MENISECTOMY  10/03/2011   Performed by Aplington, Laurice Record, MD at [facility]  . MENISECTOMY Right 10/03/2011   Performed by Aplington, Laurice Record, MD at [facility]  . WISDOM TOOTH EXTRACTION  AGE 77   [facility]AL [facility]GE[facility] OFFICE    Family History: Family History  Problem Relation Age of Onset  . Bleeding Disorder Maternal Grandmother   . Deep vein thrombosis Neg Hx   . Pulmonary embolism Neg Hx     Social History: Social History   Socioeconomic History  . Marital status: Single    Spouse name: Not on  file  . Number of children: Not on file  . Years of education: Not on file  . Highest education level: Not on file  Social Needs  . Financial resource strain: Not on file  . Food insecurity - worry: Not on file  . Food insecurity - inability: Not on file  . Transportation needs - medical: Not on file  . Transportation needs - non-medical: Not on file  Occupational History  . Not on file  Tobacco Use  . Smoking status: Former Smoker    Packs/day: 0.25    Years: 3.00    Pack years: 0.75    Last attempt to quit: 02/16/2017    Years since quitting: 0.1  . Smokeless tobacco: Never Used  Substance and Sexual Activity  . Alcohol use: Yes    Comment: OCCASIONAL on weekends  . Drug use: No  . Sexual activity: Not on file  Other Topics Concern  . Not on file  Social History Narrative  . Not on file    Allergies: No Known Allergies  Medications:  Current Outpatient Medications  Medication Sig Dispense Refill  . ferrous sulfate 325 (65 FE) MG tablet Take 325 mg by mouth daily with breakfast.    . Multiple Vitamin (MULTIVITAMIN) tablet Take 1 tablet by mouth daily. Alive    . pantoprazole (PROTONIX) 40 MG tablet Take 1 tablet (40 mg total) by mouth daily. 30 tablet 0  . polyethylene glycol (MIRALAX / GLYCOLAX) packet Take 17 g by mouth daily as needed. 14 each 0  . rivaroxaban (XARELTO) 20 MG TABS tablet Take 1 tablet (20 mg total) by mouth daily with supper. 30 tablet 1   No current facility-administered medications for this Ayers.     Review of Systems: Review of Systems  All other systems reviewed and are negative.    PHYSICAL EXAMINATION Blood pressure 135/77, pulse 86, temperature (!) 97.5 F (36.4 C), temperature source Oral, resp. rate 20, height '5\' 4"'  (1.626 m), weight 227 lb 9.6 oz (103.2 kg), SpO2 100 %.  ECOG PERFORMANCE STATUS: 1 - Symptomatic but completely ambulatory  Physical Exam  Constitutional: She is oriented to person, place, and time and  well-developed, well-nourished, and in no distress. No distress.  HENT:  Head: Normocephalic and atraumatic.  Mouth/Throat: Oropharynx is clear and moist. No oropharyngeal exudate.  Eyes: Conjunctivae and EOM are normal. Pupils are equal, round, and reactive to light. No scleral icterus.  Neck: No thyromegaly present.  Cardiovascular: Normal rate, regular rhythm and normal heart sounds. Exam reveals no gallop.  No murmur heard. Pulmonary/Chest: Effort normal and breath sounds normal. No respiratory distress. She has no wheezes. She has no rales. She exhibits no tenderness.  Abdominal: Soft. Bowel sounds are normal. She exhibits no distension.  There is no tenderness. There is no rebound.  Musculoskeletal: Normal range of motion. She exhibits no edema or tenderness.  Lymphadenopathy:    She has no cervical adenopathy.  Neurological: She is alert and oriented to person, place, and time. She has normal reflexes. No cranial nerve deficit. Coordination normal.  Skin: Skin is warm and dry. No rash noted. She is not diaphoretic. No erythema.     LABORATORY DATA: I have personally reviewed the data as listed: No visits with results within 1 Week(s) from this Ayers.  Latest known Ayers with results is:  Appointment on 03/20/2017  Component Date Value Ref Range Status  . WBC 03/20/2017 4.3  3.9 - 10.3 10e3/uL Final  . NEUT# 03/20/2017 2.0  1.5 - 6.5 10e3/uL Final  . HGB 03/20/2017 11.0* 11.6 - 15.9 g/dL Final  . HCT 03/20/2017 36.2  34.8 - 46.6 % Final  . Platelets 03/20/2017 319  145 - 400 10e3/uL Final  . MCV 03/20/2017 80.8  79.5 - 101.0 fL Final  . MCH 03/20/2017 24.6* 25.1 - 34.0 pg Final  . MCHC 03/20/2017 30.4* 31.5 - 36.0 g/dL Final  . RBC 03/20/2017 4.48  3.70 - 5.45 10e6/uL Final  . RDW 03/20/2017 23.8* 11.2 - 14.5 % Final  . lymph# 03/20/2017 1.9  0.9 - 3.3 10e3/uL Final  . MONO# 03/20/2017 0.2  0.1 - 0.9 10e3/uL Final  . Eosinophils Absolute 03/20/2017 0.1  0.0 - 0.5 10e3/uL Final   . Basophils Absolute 03/20/2017 0.0  0.0 - 0.1 10e3/uL Final  . NEUT% 03/20/2017 45.7  38.4 - 76.8 % Final  . LYMPH% 03/20/2017 45.1  14.0 - 49.7 % Final  . MONO% 03/20/2017 5.4  0.0 - 14.0 % Final  . EOS% 03/20/2017 3.1  0.0 - 7.0 % Final  . BASO% 03/20/2017 0.7  0.0 - 2.0 % Final  . Retic % 03/20/2017 1.57  0.70 - 2.10 % Final  . Retic Ct Abs 03/20/2017 70.34  33.70 - 90.70 10e3/uL Final  . Immature Retic Fract 03/20/2017 5.00  1.60 - 10.00 % Final  . Tear Drop Cells 03/20/2017 Few  Negative Final  . Ovalocytes 03/20/2017 Few  Negative Final  . Shistocytes 03/20/2017 Few  Negative Final  . White Cell Comments 03/20/2017 C/W auto diff   Final  . PLT EST 03/20/2017 308  Adequate Final  . Platelet Morphology 03/20/2017 Large Platelets  Within Normal Limits Final  . Sodium 03/20/2017 139  136 - 145 mEq/L Final  . Potassium 03/20/2017 3.6  3.5 - 5.1 mEq/L Final  . Chloride 03/20/2017 108  98 - 109 mEq/L Final  . CO2 03/20/2017 23  22 - 29 mEq/L Final  . Glucose 03/20/2017 82  70 - 140 mg/dl Final   Glucose reference range is for nonfasting patients. Fasting glucose reference range is 70- 100.  Marland Kitchen BUN 03/20/2017 8.7  7.0 - 26.0 mg/dL Final  . Creatinine 03/20/2017 0.8  0.6 - 1.1 mg/dL Final  . Total Bilirubin 03/20/2017 0.85  0.20 - 1.20 mg/dL Final  . Alkaline Phosphatase 03/20/2017 51  40 - 150 U/L Final  . AST 03/20/2017 13  5 - 34 U/L Final  . ALT 03/20/2017 10  0 - 55 U/L Final  . Total Protein 03/20/2017 7.2  6.4 - 8.3 g/dL Final  . Albumin 03/20/2017 3.4* 3.5 - 5.0 g/dL Final  . Calcium 03/20/2017 8.9  8.4 - 10.4 mg/dL Final  . Anion Gap 03/20/2017 7  3 - 11 mEq/L Final  .  EGFR 03/20/2017 >60  >60 ml/min/1.73 m2 Final   eGFR is calculated using the CKD-EPI Creatinine Equation (2009)  . LDH 03/20/2017 172  125 - 245 U/L Final  . Sedimentation Rate-Westergren 03/20/2017 36* 0 - 32 mm/hr Final  . CRP 03/20/2017 4.8  0.0 - 4.9 mg/L Final  . aPTT 03/20/2017 25  24 - 33 sec Final    Comment: This test has not been validated for monitoring unfractionated heparin therapy. aPTT-based therapeutic ranges for unfractionated heparin therapy have not been established. For general guidelines on Heparin monitoring, refer to the [employer].   . Protime 03/20/2017 10.8  10.6 - 13.4 Seconds Final  . INR 03/20/2017 0.90* 2.00 - 3.50 Final   Comment: INR is useful only to assess adequacy of anticoagulation with coumadin when comparing results from different labs. It should not be used to estimate bleeding risk or presence/abscense of coagulopathy in patients not on coumadin. Expected INR ranges for  nontherapeutic patients is 0.88 - 1.12.   Marland Kitchen Lovenox 03/20/2017 No   Final  . FIBRINOGEN 03/20/2017 360  193 - 507 mg/dL Final  . Ferritin 03/20/2017 18  9 - 269 ng/ml Final  . Iron 03/20/2017 42  41 - 142 ug/dL Final  . TIBC 03/20/2017 398  236 - 444 ug/dL Final  . UIBC 03/20/2017 356  120 - 384 ug/dL Final  . %SAT 03/20/2017 11* 21 - 57 % Final  . Folate 03/20/2017 >20.0  >3.0 ng/mL Final   Comment: A serum folate concentration of less than 3.1 ng/mL is considered to represent clinical deficiency.   . Vitamin B12 03/20/2017 302  232 - 1,245 pg/mL Final  . Haptoglobin 03/20/2017 136  34 - 200 mg/dL Final  . Coombs', Direct 03/20/2017 Negative  Negative Final       Ardath Sax, MD

## 2017-04-05 NOTE — Assessment & Plan Note (Signed)
41 y.o. female pulmonary embolism evaluation.  She was diagnosed at the end of Sep 2018, but symptoms appear to have started earlier and potentially as early as Aug 2018 without any precipitating events out of the ordinary for the patient.  She does have regular long-range travel, but it has not been longer than usual or more frequent than usual.  No history of surgery, trauma, or pregnancy.  Patient is a smoker, but does not use any oral contraceptives and denies any other changes in medications or supplements. Patient did have a significant burden of clot without right ventricular strain as well as an extensive deep vein thrombosis in the left lower extremity.  She was treated appropriately with rivaroxaban.  Of interest, patient had stool positive for blood prior to initiation of therapeutic anticoagulation.  Additionally, on presentation to the emergency room in September she was already severely anemic with microcytic hypochromic anemia and thrombocytosis consistent with gastrointestinal blood loss. Iron panel obtained at that time supports iron deficiency.  Repeat lab work obtained several days later continued to show progressive thrombocytosis which is likely reactive. Since anticoagulation, patient has been experiencing heavy menstrual bleeding.  No other pathological bleeding sources found so far. This creates a complicated situation with bleeding and clotting occurring at the same time with significant amount of anemia present.  It appears that the anemia actually predates the development of thrombosis with positive stool blood raising concern for possible abnormalities in the GI tract such as malignancy as a precipitating cause for the thrombosis.  We do need to assess patient for gastrointestinal abnormalities with colonoscopy and EGD based on the iron deficiency anemia present.  Interruption of therapeutic anticoagulation at this time he is also quite dangerous.  Everything considered, we are likely  able to maintain patient's anemia better and continue anticoagulation other than interrupting anticoagulation at this time and risking clot propagation.  Blood work demonstrates stable hemoglobin of 11.0 with resolution of interval thrombocytosis down to normal platelet values consistent with improving iron deficiency and likely cessation of gastrointestinal blood loss.  Plan: --Continue iron supplementation by mouth --Anticoagulation:     --Continue rivaroxaban 20 mg daily  --EGD/colonoscopy is recommended.  Options are to it until anticoagulation has been present for at least 3 months or the alternative is placement of Infuse-a-Port or temporary protection against further embolization from the left lower extremity, interruption of therapeutic anticoagulation and conduction of colonoscopy EGD ASAP.  --Regarding menstrual bleeding, patient will likely require indefinite anticoagulation based on severity of the thrombotic event.  In this setting, direct intervention on the uterus would be indicated included but not limited to possible hysterectomy.  Once again, if surgery is planned, anticoagulation can be interrupted as long as an IVC filter is placed preemptively. --Return to my clinic in 3 months for hematological monitoring, or sooner if new symptoms appear

## 2017-04-07 DIAGNOSIS — R609 Edema, unspecified: Secondary | ICD-10-CM | POA: Diagnosis not present

## 2017-04-07 DIAGNOSIS — M5416 Radiculopathy, lumbar region: Secondary | ICD-10-CM | POA: Diagnosis not present

## 2017-04-23 DIAGNOSIS — R102 Pelvic and perineal pain: Secondary | ICD-10-CM | POA: Diagnosis not present

## 2017-04-23 DIAGNOSIS — N800 Endometriosis of uterus: Secondary | ICD-10-CM | POA: Diagnosis not present

## 2017-04-23 DIAGNOSIS — E569 Vitamin deficiency, unspecified: Secondary | ICD-10-CM | POA: Diagnosis not present

## 2017-04-23 DIAGNOSIS — N920 Excessive and frequent menstruation with regular cycle: Secondary | ICD-10-CM | POA: Diagnosis not present

## 2017-04-29 DIAGNOSIS — R87618 Other abnormal cytological findings on specimens from cervix uteri: Secondary | ICD-10-CM | POA: Diagnosis not present

## 2017-04-29 DIAGNOSIS — Z6838 Body mass index (BMI) 38.0-38.9, adult: Secondary | ICD-10-CM | POA: Diagnosis not present

## 2017-04-29 DIAGNOSIS — Z113 Encounter for screening for infections with a predominantly sexual mode of transmission: Secondary | ICD-10-CM | POA: Diagnosis not present

## 2017-04-29 DIAGNOSIS — Z01411 Encounter for gynecological examination (general) (routine) with abnormal findings: Secondary | ICD-10-CM | POA: Diagnosis not present

## 2017-04-29 DIAGNOSIS — Z124 Encounter for screening for malignant neoplasm of cervix: Secondary | ICD-10-CM | POA: Diagnosis not present

## 2017-05-13 DIAGNOSIS — N920 Excessive and frequent menstruation with regular cycle: Secondary | ICD-10-CM | POA: Diagnosis not present

## 2017-05-14 ENCOUNTER — Telehealth: Payer: Self-pay | Admitting: Hematology and Oncology

## 2017-05-14 NOTE — Telephone Encounter (Signed)
Patient called in wanting to schedule with Dr. She said her GYN said to do so as soon as possible. I did tell her about February 2019

## 2017-05-15 ENCOUNTER — Telehealth: Payer: Self-pay

## 2017-05-15 NOTE — Telephone Encounter (Signed)
Call received from [place] at [facility]. Dr. Cletis Media is requesting patient have an appointment with Dr. Lebron Conners next week due to abnormal labs. High priority scheduling message sent to schedule patient 05/21/17 or 05/22/17 when Dr. Lebron Conners returns to the office.

## 2017-05-21 ENCOUNTER — Telehealth: Payer: Self-pay

## 2017-05-21 NOTE — Telephone Encounter (Signed)
A call was placed and a request was made to have [facility] fax over patients labs from previous visit.  Medical Records stated they would fax them over.

## 2017-05-22 ENCOUNTER — Ambulatory Visit (HOSPITAL_BASED_OUTPATIENT_CLINIC_OR_DEPARTMENT_OTHER): Payer: BLUE CROSS/BLUE SHIELD

## 2017-05-22 ENCOUNTER — Telehealth: Payer: Self-pay | Admitting: Hematology and Oncology

## 2017-05-22 ENCOUNTER — Ambulatory Visit (HOSPITAL_BASED_OUTPATIENT_CLINIC_OR_DEPARTMENT_OTHER): Payer: BLUE CROSS/BLUE SHIELD | Admitting: Hematology and Oncology

## 2017-05-22 ENCOUNTER — Encounter: Payer: Self-pay | Admitting: Hematology and Oncology

## 2017-05-22 VITALS — BP 150/82 | HR 98 | Temp 98.5°F | Resp 18 | Wt 223.0 lb

## 2017-05-22 DIAGNOSIS — R0781 Pleurodynia: Secondary | ICD-10-CM | POA: Diagnosis not present

## 2017-05-22 DIAGNOSIS — R945 Abnormal results of liver function studies: Secondary | ICD-10-CM

## 2017-05-22 DIAGNOSIS — I2699 Other pulmonary embolism without acute cor pulmonale: Secondary | ICD-10-CM | POA: Diagnosis not present

## 2017-05-22 DIAGNOSIS — D75839 Thrombocytosis, unspecified: Secondary | ICD-10-CM

## 2017-05-22 DIAGNOSIS — R7989 Other specified abnormal findings of blood chemistry: Secondary | ICD-10-CM

## 2017-05-22 DIAGNOSIS — Z7289 Other problems related to lifestyle: Secondary | ICD-10-CM

## 2017-05-22 DIAGNOSIS — Z87891 Personal history of nicotine dependence: Secondary | ICD-10-CM

## 2017-05-22 DIAGNOSIS — D473 Essential (hemorrhagic) thrombocythemia: Secondary | ICD-10-CM

## 2017-05-22 DIAGNOSIS — D500 Iron deficiency anemia secondary to blood loss (chronic): Secondary | ICD-10-CM | POA: Diagnosis not present

## 2017-05-22 LAB — CBC WITH DIFFERENTIAL/PLATELET
BASO%: 1 % (ref 0.0–2.0)
Basophils Absolute: 0.1 10*3/uL (ref 0.0–0.1)
EOS%: 4.3 % (ref 0.0–7.0)
Eosinophils Absolute: 0.3 10*3/uL (ref 0.0–0.5)
HCT: 34.3 % — ABNORMAL LOW (ref 34.8–46.6)
HGB: 10.7 g/dL — ABNORMAL LOW (ref 11.6–15.9)
LYMPH%: 34.5 % (ref 14.0–49.7)
MCH: 23.6 pg — ABNORMAL LOW (ref 25.1–34.0)
MCHC: 31.1 g/dL — ABNORMAL LOW (ref 31.5–36.0)
MCV: 75.8 fL — ABNORMAL LOW (ref 79.5–101.0)
MONO#: 0.6 10*3/uL (ref 0.1–0.9)
MONO%: 8.4 % (ref 0.0–14.0)
NEUT#: 3.8 10*3/uL (ref 1.5–6.5)
NEUT%: 51.8 % (ref 38.4–76.8)
Platelets: 639 10*3/uL — ABNORMAL HIGH (ref 145–400)
RBC: 4.52 10*6/uL (ref 3.70–5.45)
RDW: 19.2 % — ABNORMAL HIGH (ref 11.2–14.5)
WBC: 7.3 10*3/uL (ref 3.9–10.3)
lymph#: 2.5 10*3/uL (ref 0.9–3.3)

## 2017-05-22 LAB — COMPREHENSIVE METABOLIC PANEL
ALT: 98 U/L — ABNORMAL HIGH (ref 0–55)
AST: 34 U/L (ref 5–34)
Albumin: 3.6 g/dL (ref 3.5–5.0)
Alkaline Phosphatase: 84 U/L (ref 40–150)
Anion Gap: 9 mEq/L (ref 3–11)
BUN: 10 mg/dL (ref 7.0–26.0)
CO2: 24 mEq/L (ref 22–29)
Calcium: 9.6 mg/dL (ref 8.4–10.4)
Chloride: 106 mEq/L (ref 98–109)
Creatinine: 1 mg/dL (ref 0.6–1.1)
EGFR: >60 mL/min/{1.73_m2} (ref >60)
Glucose: 100 mg/dl (ref 70–140)
Potassium: 4.2 mEq/L (ref 3.5–5.1)
Sodium: 139 mEq/L (ref 136–145)
Total Bilirubin: 0.4 mg/dL (ref 0.20–1.20)
Total Protein: 8.1 g/dL (ref 6.4–8.3)

## 2017-05-22 NOTE — Telephone Encounter (Signed)
Scheduled appt per 1/4 los - Gave patient AVS and calender per los.  

## 2017-06-03 DIAGNOSIS — R7989 Other specified abnormal findings of blood chemistry: Secondary | ICD-10-CM | POA: Insufficient documentation

## 2017-06-03 DIAGNOSIS — R945 Abnormal results of liver function studies: Principal | ICD-10-CM

## 2017-06-03 NOTE — Assessment & Plan Note (Signed)
42 y.o. 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  Patient reports significant alcohol intake prior to the lab work obtained which was likely the cause for the elevation.  Nevertheless, the pattern is suggestive of hepatocellular injury and patient is having some discomfort in her liver.  Plan: -- Repeat LFTs today.  If still elevated or getting worse, will obtain right upper quadrant ultrasound. --Return to our clinic as previously scheduled for hematological monitoring.

## 2017-06-03 NOTE — Progress Notes (Signed)
[facility] Follow-up Visit:  Assessment: LFT elevation 42 y.o. 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  Patient reports significant alcohol intake prior to the lab work obtained which was likely the cause for the elevation.  Nevertheless, the pattern is suggestive of hepatocellular injury and patient is having some discomfort in her liver.  Plan: -- Repeat LFTs today.  If still elevated or getting worse, will obtain right upper quadrant ultrasound. --Return to our clinic as previously scheduled for hematological monitoring.  Voice recognition software was used and creation of this note. Despite my best effort at editing the text, some misspelling/errors may have occurred.  Orders Placed This Encounter  Procedures  . CBC with Differential    Standing Status:   Future    Number of Occurrences:   1    Standing Expiration Date:   05/22/2018  . Comprehensive metabolic panel    Standing Status:   Future    Number of Occurrences:   1    Standing Expiration Date:   05/22/2018    Cancer Staging No matching staging information was found for the patient.  All questions were answered.  . The patient knows to call the clinic with any problems, questions or concerns.  This note was electronically signed.    History of Presenting Illness Sarah Ayers is a 42 y.o. female followed in the [facility] for evaluation and recommendations regarding management of a recent pulmonary embolism.  Patient is referred to [place] by Dr. Katharine Look Rivard.  Please see outline of hematological history below.  Patient is currently on therapeutic anticoagulation with rivaroxaban.  Appears to be tolerating medication well with exception of heavy menstrual periods with symptomatic anemia that has required blood transfusions in the past.  Patient  was referred back to our clinic by her gynecologist due to "abnormal labs".  Full review of the provided lab work, patient has developed acute elevation of AST and ALT on the labs obtained on 05/15/17.  This was accompanied by significant decline of her hemoglobin down from November values.  On additional questioning, patient was having a party over the previous days prior to the blood draw and had more than 4 beers the night prior.  She cannot exactly tell me how many drinks did she have at the time.  At this time, patient is feeling reasonably well.  She does have complaints of pain in the right-sided ribs, but denies any nausea, vomiting, diarrhea, or abdominal pain.  She denies having any yellow discoloration of her eyes or her skin.  Oncological/hematological History: --Event #1, 02/14/17: Patient presented on 02/14/17 to the emergency room with pleuritic chest pain and shortness of breath present for approximately 1 month.  On additional questioning, patient was seen in the emergency room in August and initially diagnosed with pneumonia or upper respiratory tract infection and treated with Z-Pak without relief of symptoms.  Subsequently, patient traveled to [place] in September which she does every month.  At the end of the month, she has presented to the emergency room with the above symptoms.             --Labs, 02/14/17: WBC 8.9, Hgb 7.8, MCV 70.7, MCH 21.5, RDW 19.7, Plt 546; Fe 11, FeSat 3%, TIBC 374, Ferritin 22, Folate 16.8, Vit B12 339, FOBT+             --  CTA Chest, 02/15/17: Large right pulmonary embolism in the main pulmonary artery, right lower lobe artery with multisegmental embolization.  No evidence of right ventricular strain, possible right upper lobe infarction.             --Doppler US BL LE, 02/15/17: Active acute thrombus in the right common femoral, right femoral, right profunda femoris, right popliteal, right posterior tibial and right peroneal veins.  No active deep vein  thrombosis in the left side.             --ECHO, 02/16/17: Normal cardiac ultrasound.             --Labs, 02/17/17: WBC 8.6, Hgb 7.4, MCV 71.2, MCH 20.1, RDW 19.2, Plt 739;  Treatment:             --Heparin gtt, 02/14/17             --Rivaroxaban, Sep 3474-: Complicated by heavy menstrual periods. --Labs, 03/20/17: WBC 4.3, Hgb 11.0, MCV 80.8, MCH 24.6, RDW 23.8, Plt 319; Fe 42, FeSat 11%, TIBC 398, Ferritin 18, Folate >20.0, Vit B12 302; Haptoglobin 136, LDH 172, DAT -- negative;  --Labs, 05/15/17: WBC 8.7, Hgb   9.8, MCV 78.0, MCH 23.4, RDW 16.6, Plt 684; tBili 0.3, AP 67, AST 178, ALT 261   Medical History: Past Medical History:  Diagnosis Date  . Acute meniscal tear, lateral RIGHT  . Endometriosis   . Knee swelling, right     Surgical History: Past Surgical History:  Procedure Laterality Date  . MENISECTOMY  10/03/2011   Procedure: MENISECTOMY;  Surgeon: Magnus Sinning, MD;  Location: [facility];  Service: Orthopedics;  Laterality: Right;   arthroscopic partial lateral menisectomy  . WISDOM TOOTH EXTRACTION  AGE 50   ORAL SURGEON OFFICE    Family History: Family History  Problem Relation Age of Onset  . Bleeding Disorder Maternal Grandmother   . Deep vein thrombosis Neg Hx   . Pulmonary embolism Neg Hx     Social History: Social History   Socioeconomic History  . Marital status: Single    Spouse name: Not on file  . Number of children: Not on file  . Years of education: Not on file  . Highest education level: Not on file  Social Needs  . Financial resource strain: Not on file  . Food insecurity - worry: Not on file  . Food insecurity - inability: Not on file  . Transportation needs - medical: Not on file  . Transportation needs - non-medical: Not on file  Occupational History  . Not on file  Tobacco Use  . Smoking status: Former Smoker    Packs/day: 0.25    Years: 3.00    Pack years: 0.75    Last attempt to quit: 02/16/2017    Years since  quitting: 0.2  . Smokeless tobacco: Never Used  Substance and Sexual Activity  . Alcohol use: Yes    Comment: OCCASIONAL on weekends  . Drug use: No  . Sexual activity: Not on file  Other Topics Concern  . Not on file  Social History Narrative  . Not on file    Allergies: No Known Allergies  Medications:  Current Outpatient Medications  Medication Sig Dispense Refill  . ferrous sulfate 325 (65 FE) MG tablet Take 325 mg by mouth daily with breakfast.    . Multiple Vitamin (MULTIVITAMIN) tablet Take 1 tablet by mouth daily. Alive    . pantoprazole (PROTONIX) 40 MG tablet Take 1 tablet (40  mg total) by mouth daily. 30 tablet 0  . polyethylene glycol (MIRALAX / GLYCOLAX) packet Take 17 g by mouth daily as needed. 14 each 0  . rivaroxaban (XARELTO) 20 MG TABS tablet Take 1 tablet (20 mg total) by mouth daily with supper. 30 tablet 1   No current facility-administered medications for this visit.     Review of Systems: Review of Systems  All other systems reviewed and are negative.    PHYSICAL EXAMINATION Blood pressure (!) 150/82, pulse 98, temperature 98.5 F (36.9 C), temperature source Oral, resp. rate 18, weight 223 lb (101.2 kg), SpO2 100 %.  ECOG PERFORMANCE STATUS: 1 - Symptomatic but completely ambulatory  Physical Exam  Constitutional: She is oriented to person, place, and time and well-developed, well-nourished, and in no distress. No distress.  HENT:  Head: Normocephalic and atraumatic.  Mouth/Throat: Oropharynx is clear and moist. No oropharyngeal exudate.  Eyes: Conjunctivae and EOM are normal. Pupils are equal, round, and reactive to light. No scleral icterus.  Neck: No thyromegaly present.  Cardiovascular: Normal rate, regular rhythm and normal heart sounds. Exam reveals no gallop.  No murmur heard. Pulmonary/Chest: Effort normal and breath sounds normal. No respiratory distress. She has no wheezes. She has no rales. She exhibits no tenderness.  Abdominal:  Soft. Bowel sounds are normal. She exhibits no distension. There is no tenderness. There is no rebound.  Musculoskeletal: Normal range of motion. She exhibits no edema or tenderness.  Lymphadenopathy:    She has no cervical adenopathy.  Neurological: She is alert and oriented to person, place, and time. She has normal reflexes. No cranial nerve deficit. Coordination normal.  Skin: Skin is warm and dry. No rash noted. She is not diaphoretic. No erythema.     LABORATORY DATA: I have personally reviewed the data as listed: Appointment on 05/22/2017  Component Date Value Ref Range Status  . Sodium 05/22/2017 139  136 - 145 mEq/L Final  . Potassium 05/22/2017 4.2  3.5 - 5.1 mEq/L Final  . Chloride 05/22/2017 106  98 - 109 mEq/L Final  . CO2 05/22/2017 24  22 - 29 mEq/L Final  . Glucose 05/22/2017 100  70 - 140 mg/dl Final   Glucose reference range is for nonfasting patients. Fasting glucose reference range is 70- 100.  Marland Kitchen BUN 05/22/2017 10.0  7.0 - 26.0 mg/dL Final  . Creatinine 05/22/2017 1.0  0.6 - 1.1 mg/dL Final  . Total Bilirubin 05/22/2017 0.40  0.20 - 1.20 mg/dL Final  . Alkaline Phosphatase 05/22/2017 84  40 - 150 U/L Final  . AST 05/22/2017 34  5 - 34 U/L Final  . ALT 05/22/2017 98* 0 - 55 U/L Final  . Total Protein 05/22/2017 8.1  6.4 - 8.3 g/dL Final  . Albumin 05/22/2017 3.6  3.5 - 5.0 g/dL Final  . Calcium 05/22/2017 9.6  8.4 - 10.4 mg/dL Final  . Anion Gap 05/22/2017 9  3 - 11 mEq/L Final  . EGFR 05/22/2017 >60  >60 ml/min/1.73 m2 Final   eGFR is calculated using the CKD-EPI Creatinine Equation (2009)  . WBC 05/22/2017 7.3  3.9 - 10.3 10e3/uL Final  . NEUT# 05/22/2017 3.8  1.5 - 6.5 10e3/uL Final  . HGB 05/22/2017 10.7* 11.6 - 15.9 g/dL Final  . HCT 05/22/2017 34.3* 34.8 - 46.6 % Final  . Platelets 05/22/2017 639* 145 - 400 10e3/uL Final  . MCV 05/22/2017 75.8* 79.5 - 101.0 fL Final  . MCH 05/22/2017 23.6* 25.1 - 34.0 pg Final  .  MCHC 05/22/2017 31.1* 31.5 - 36.0 g/dL  Final  . RBC 05/22/2017 4.52  3.70 - 5.45 10e6/uL Final  . RDW 05/22/2017 19.2* 11.2 - 14.5 % Final  . lymph# 05/22/2017 2.5  0.9 - 3.3 10e3/uL Final  . MONO# 05/22/2017 0.6  0.1 - 0.9 10e3/uL Final  . Eosinophils Absolute 05/22/2017 0.3  0.0 - 0.5 10e3/uL Final  . Basophils Absolute 05/22/2017 0.1  0.0 - 0.1 10e3/uL Final  . NEUT% 05/22/2017 51.8  38.4 - 76.8 % Final  . LYMPH% 05/22/2017 34.5  14.0 - 49.7 % Final  . MONO% 05/22/2017 8.4  0.0 - 14.0 % Final  . EOS% 05/22/2017 4.3  0.0 - 7.0 % Final  . BASO% 05/22/2017 1.0  0.0 - 2.0 % Final       Ardath Sax, MD

## 2017-06-04 ENCOUNTER — Ambulatory Visit: Payer: BLUE CROSS/BLUE SHIELD | Admitting: Hematology and Oncology

## 2017-06-24 ENCOUNTER — Inpatient Hospital Stay: Payer: BLUE CROSS/BLUE SHIELD | Attending: Hematology and Oncology

## 2017-06-24 DIAGNOSIS — I2699 Other pulmonary embolism without acute cor pulmonale: Secondary | ICD-10-CM | POA: Insufficient documentation

## 2017-06-24 DIAGNOSIS — R0781 Pleurodynia: Secondary | ICD-10-CM | POA: Insufficient documentation

## 2017-06-24 DIAGNOSIS — D500 Iron deficiency anemia secondary to blood loss (chronic): Secondary | ICD-10-CM | POA: Insufficient documentation

## 2017-06-24 DIAGNOSIS — D75839 Thrombocytosis, unspecified: Secondary | ICD-10-CM

## 2017-06-24 DIAGNOSIS — D473 Essential (hemorrhagic) thrombocythemia: Secondary | ICD-10-CM

## 2017-06-24 LAB — CBC WITH DIFFERENTIAL/PLATELET
Basophils Absolute: 0.1 10*3/uL (ref 0.0–0.1)
Basophils Relative: 1 %
Eosinophils Absolute: 0.2 10*3/uL (ref 0.0–0.5)
Eosinophils Relative: 4 %
HCT: 36.3 % (ref 34.8–46.6)
Hemoglobin: 11.5 g/dL — ABNORMAL LOW (ref 11.6–15.9)
Lymphocytes Relative: 31 %
Lymphs Abs: 1.7 10*3/uL (ref 0.9–3.3)
MCH: 23.7 pg — ABNORMAL LOW (ref 25.1–34.0)
MCHC: 31.6 g/dL (ref 31.5–36.0)
MCV: 75.1 fL — ABNORMAL LOW (ref 79.5–101.0)
Monocytes Absolute: 0.3 10*3/uL (ref 0.1–0.9)
Monocytes Relative: 6 %
Neutro Abs: 3.1 10*3/uL (ref 1.5–6.5)
Neutrophils Relative %: 58 %
Platelets: 422 10*3/uL — ABNORMAL HIGH (ref 145–400)
RBC: 4.83 MIL/uL (ref 3.70–5.45)
RDW: 18.9 % — ABNORMAL HIGH (ref 11.2–14.5)
WBC: 5.3 10*3/uL (ref 3.9–10.3)

## 2017-06-24 LAB — COMPREHENSIVE METABOLIC PANEL
ALT: 11 U/L (ref 0–55)
AST: 13 U/L (ref 5–34)
Albumin: 3.2 g/dL — ABNORMAL LOW (ref 3.5–5.0)
Alkaline Phosphatase: 67 U/L (ref 40–150)
Anion gap: 8 (ref 3–11)
BUN: 10 mg/dL (ref 7–26)
CO2: 23 mmol/L (ref 22–29)
Calcium: 9.3 mg/dL (ref 8.4–10.4)
Chloride: 110 mmol/L — ABNORMAL HIGH (ref 98–109)
Creatinine, Ser: 0.87 mg/dL (ref 0.60–1.10)
GFR calc Af Amer: >60 mL/min (ref >60)
GFR calc non Af Amer: >60 mL/min (ref >60)
Glucose, Bld: 89 mg/dL (ref 70–140)
Potassium: 4.2 mmol/L (ref 3.5–5.1)
Sodium: 141 mmol/L (ref 136–145)
Total Bilirubin: 0.7 mg/dL (ref 0.2–1.2)
Total Protein: 7.3 g/dL (ref 6.4–8.3)

## 2017-06-24 LAB — IRON AND TIBC
Iron: 27 ug/dL — ABNORMAL LOW (ref 41–142)
Saturation Ratios: 7 % — ABNORMAL LOW (ref 21–57)
TIBC: 375 ug/dL (ref 236–444)
UIBC: 348 ug/dL

## 2017-06-24 LAB — FERRITIN: Ferritin: 24 ng/mL (ref 9–269)

## 2017-06-26 ENCOUNTER — Inpatient Hospital Stay (HOSPITAL_BASED_OUTPATIENT_CLINIC_OR_DEPARTMENT_OTHER): Payer: BLUE CROSS/BLUE SHIELD | Admitting: Hematology and Oncology

## 2017-06-26 ENCOUNTER — Encounter: Payer: Self-pay | Admitting: Hematology and Oncology

## 2017-06-26 ENCOUNTER — Telehealth: Payer: Self-pay | Admitting: Hematology and Oncology

## 2017-06-26 VITALS — BP 141/89 | HR 93 | Temp 98.6°F | Resp 18 | Ht 64.0 in | Wt 218.8 lb

## 2017-06-26 DIAGNOSIS — I2699 Other pulmonary embolism without acute cor pulmonale: Secondary | ICD-10-CM | POA: Diagnosis not present

## 2017-06-26 DIAGNOSIS — D500 Iron deficiency anemia secondary to blood loss (chronic): Secondary | ICD-10-CM

## 2017-06-26 DIAGNOSIS — R071 Chest pain on breathing: Secondary | ICD-10-CM

## 2017-06-26 DIAGNOSIS — R0781 Pleurodynia: Secondary | ICD-10-CM | POA: Diagnosis not present

## 2017-06-26 NOTE — Telephone Encounter (Signed)
Patient scheduled AVS/Sched printed

## 2017-07-19 NOTE — Progress Notes (Signed)
[facility] Follow-up Visit:  Assessment: Iron deficiency anemia secondary to blood loss (chronic) 42 y.o. 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  During previous visit, acute elevation of transaminases was noted and was attributed to preceding alcohol intake.  Since last visit to the clinic, transaminitis resolved completely.  Interval labs demonstrate improving anemia and slowly rising iron storage consistent with good compliance with oral iron intake.  Plan: -Continue ferrous sulfate 325 mg twice daily -Return to my clinic in 3 months with labs obtained 3 days prior to assess iron replenishment.  Voice recognition software was used and creation of this note. Despite my best effort at editing the text, some misspelling/errors may have occurred.  Orders Placed This Encounter  Procedures  . CBC with Differential (Cancer Center Only)    Standing Status:   Future    Standing Expiration Date:   06/26/2018  . CMP ([facility] only)    Standing Status:   Future    Standing Expiration Date:   06/26/2018  . Iron and TIBC    Standing Status:   Future    Standing Expiration Date:   06/26/2018  . Ferritin    Standing Status:   Future    Standing Expiration Date:   06/26/2018    Cancer Staging No matching staging information was found for the patient.  All questions were answered.  . The patient knows to call the clinic with any problems, questions or concerns.  This note was electronically signed.    History of Presenting Illness Sarah Ayers is a 42 y.o. female followed in the [facility] for evaluation and recommendations regarding management of a recent pulmonary embolism.  Patient is referred to [place] by Dr. Katharine Look Rivard.  Please see outline of hematological history below.  Patient is currently on therapeutic anticoagulation with rivaroxaban.  Appears to be tolerating medication well with exception of heavy menstrual  periods with symptomatic anemia that has required blood transfusions in the past.  Patient was referred back to our clinic by her gynecologist due to "abnormal labs".  Full review of the provided lab work, patient has developed acute elevation of AST and ALT on the labs obtained on 05/15/17.  This was accompanied by significant decline of her hemoglobin down from November values.  On additional questioning, patient was having a party over the previous days prior to the blood draw and had more than 4 beers the night prior.  She cannot exactly tell me how many drinks did she have at the time.  At this time, patient is feeling reasonably well.  She does have complaints of pain in the right-sided ribs, but denies any nausea, vomiting, diarrhea, or abdominal pain.  She denies having any yellow discoloration of her eyes or her skin.  Patient has been explained from alcohol since the last visit to the clinic.  Reports good compliance with twice daily ferrous sulfate.  Denies any new complaints.  Oncological/hematological History: --Event #1, 02/14/17: Patient presented on 02/14/17 to the emergency room with pleuritic chest pain and shortness of breath present for approximately 1 month.  On additional questioning, patient was seen in the emergency room in August and initially diagnosed with pneumonia or upper respiratory tract infection and treated with Z-Pak without relief of symptoms.  Subsequently, patient traveled to [place] in September which she does every month.  At the end of the month, she has presented to the emergency room with  the above symptoms.             --Labs, 02/14/17: WBC 8.9, Hgb 7.8, MCV 70.7, MCH 21.5, RDW 19.7, Plt 546; Fe 11, FeSat 3%, TIBC 374, Ferritin 22, Folate 16.8, Vit B12 339, FOBT+             --CTA Chest, 02/15/17: Large right pulmonary embolism in the main pulmonary artery, right lower lobe artery with multisegmental embolization.  No evidence of right ventricular strain,  possible right upper lobe infarction.             --Doppler US BL LE, 02/15/17: Active acute thrombus in the right common femoral, right femoral, right profunda femoris, right popliteal, right posterior tibial and right peroneal veins.  No active deep vein thrombosis in the left side.             --ECHO, 02/16/17: Normal cardiac ultrasound.             --Labs, 02/17/17: WBC 8.6, Hgb 7.4, MCV 71.2, MCH 20.1, RDW 19.2, Plt 739;  Treatment:             --Heparin gtt, 02/14/17             --Rivaroxaban, Sep 0867-: Complicated by heavy menstrual periods. --Labs, 03/20/17: WBC 4.3, Hgb 11.0, MCV 80.8, MCH 24.6,            RDW 23.8, Plt 319; Fe 42, FeSat 11%, TIBC 398, Ferritin 18, Folate >20.0, Vit B12 302; Haptoglobin 136, LDH 172, DAT -- negative;  --Labs, 05/15/17: WBC 8.7, Hgb   9.8, MCV 78.0, MCH 23.4,            RDW 16.6, Plt 684;          tBili 0.3, AP 67, AST 178, ALT 261 --Labs, 06/24/17: WBC 5.3, Hgb 11.5, MCV 75.1, MCH 23.7, MCHC 31.6, RDW 18.9, Plt 422; Fe 27, FeSat  7%, TIBC 375, Ferritin 24; tBili 0.7, AP 67, AST   13, ALT 11;    Medical History: Past Medical History:  Diagnosis Date  . Acute meniscal tear, lateral RIGHT  . Endometriosis   . Knee swelling, right     Surgical History: Past Surgical History:  Procedure Laterality Date  . MENISECTOMY  10/03/2011   Procedure: MENISECTOMY;  Surgeon: Magnus Sinning, MD;  Location: [facility];  Service: Orthopedics;  Laterality: Right;   arthroscopic partial lateral menisectomy  . WISDOM TOOTH EXTRACTION  AGE 72   ORAL SURGEON OFFICE    Family History: Family History  Problem Relation Age of Onset  . Bleeding Disorder Maternal Grandmother   . Deep vein thrombosis Neg Hx   . Pulmonary embolism Neg Hx     Social History: Social History   Socioeconomic History  . Marital status: Single    Spouse name: Not on file  . Number of children: Not on file  . Years of education: Not on file  . Highest education  level: Not on file  Social Needs  . Financial resource strain: Not on file  . Food insecurity - worry: Not on file  . Food insecurity - inability: Not on file  . Transportation needs - medical: Not on file  . Transportation needs - non-medical: Not on file  Occupational History  . Not on file  Tobacco Use  . Smoking status: Former Smoker    Packs/day: 0.25    Years: 3.00    Pack years: 0.75    Last attempt to quit: 02/16/2017  Years since quitting: 0.4  . Smokeless tobacco: Never Used  Substance and Sexual Activity  . Alcohol use: Yes    Comment: OCCASIONAL on weekends  . Drug use: No  . Sexual activity: Not on file  Other Topics Concern  . Not on file  Social History Narrative  . Not on file    Allergies: No Known Allergies  Medications:  Current Outpatient Medications  Medication Sig Dispense Refill  . ferrous sulfate 325 (65 FE) MG tablet Take 325 mg by mouth daily with breakfast.    . Multiple Vitamin (MULTIVITAMIN) tablet Take 1 tablet by mouth daily. Alive    . pantoprazole (PROTONIX) 40 MG tablet Take 1 tablet (40 mg total) by mouth daily. 30 tablet 0  . polyethylene glycol (MIRALAX / GLYCOLAX) packet Take 17 g by mouth daily as needed. 14 each 0  . rivaroxaban (XARELTO) 20 MG TABS tablet Take 1 tablet (20 mg total) by mouth daily with supper. 30 tablet 1   No current facility-administered medications for this visit.     Review of Systems: Review of Systems  All other systems reviewed and are negative.    PHYSICAL EXAMINATION Blood pressure (!) 141/89, pulse 93, temperature 98.6 F (37 C), temperature source Oral, resp. rate 18, height '5\' 4"'  (1.626 m), weight 218 lb 12.8 oz (99.2 kg), SpO2 100 %.  ECOG PERFORMANCE STATUS: 1 - Symptomatic but completely ambulatory  Physical Exam  Constitutional: She is oriented to person, place, and time and well-developed, well-nourished, and in no distress. No distress.  HENT:  Head: Normocephalic and atraumatic.   Mouth/Throat: Oropharynx is clear and moist. No oropharyngeal exudate.  Eyes: Conjunctivae and EOM are normal. Pupils are equal, round, and reactive to light. No scleral icterus.  Neck: No thyromegaly present.  Cardiovascular: Normal rate, regular rhythm and normal heart sounds. Exam reveals no gallop.  No murmur heard. Pulmonary/Chest: Effort normal and breath sounds normal. No respiratory distress. She has no wheezes. She has no rales. She exhibits no tenderness.  Abdominal: Soft. Bowel sounds are normal. She exhibits no distension. There is no tenderness. There is no rebound.  Musculoskeletal: Normal range of motion. She exhibits no edema or tenderness.  Lymphadenopathy:    She has no cervical adenopathy.  Neurological: She is alert and oriented to person, place, and time. She has normal reflexes. No cranial nerve deficit. Coordination normal.  Skin: Skin is warm and dry. No rash noted. She is not diaphoretic. No erythema.     LABORATORY DATA: I have personally reviewed the data as listed: Appointment on 06/24/2017  Component Date Value Ref Range Status  . Iron 06/24/2017 27* 41 - 142 ug/dL Final  . TIBC 06/24/2017 375  236 - 444 ug/dL Final  . Saturation Ratios 06/24/2017 7* 21 - 57 % Final  . UIBC 06/24/2017 348  ug/dL Final   Performed at [facility], [place] [address], [place], [place] [address]  . Ferritin 06/24/2017 24  9 - 269 ng/mL Final   Performed at [facility], [place] [address], [place], [place] [address]  . Sodium 06/24/2017 141  136 - 145 mmol/L Final  . Potassium 06/24/2017 4.2  3.5 - 5.1 mmol/L Final  . Chloride 06/24/2017 110* 98 - 109 mmol/L Final  . CO2 06/24/2017 23  22 - 29 mmol/L Final  . Glucose, Bld 06/24/2017 89  70 - 140 mg/dL Final  . BUN 06/24/2017 10  7 - 26 mg/dL Final  .  Creatinine, Ser 06/24/2017 0.87  0.60 - 1.10 mg/dL Final  . Calcium 06/24/2017 9.3  8.4 - 10.4 mg/dL Final  . Total Protein  06/24/2017 7.3  6.4 - 8.3 g/dL Final  . Albumin 06/24/2017 3.2* 3.5 - 5.0 g/dL Final  . AST 06/24/2017 13  5 - 34 U/L Final  . ALT 06/24/2017 11  0 - 55 U/L Final  . Alkaline Phosphatase 06/24/2017 67  40 - 150 U/L Final  . Total Bilirubin 06/24/2017 0.7  0.2 - 1.2 mg/dL Final  . GFR calc non Af Amer 06/24/2017 >60  >60 mL/min Final  . GFR calc Af Amer 06/24/2017 >60  >60 mL/min Final   Comment: (NOTE) The eGFR has been calculated using the CKD EPI equation. This calculation has not been validated in all clinical situations. eGFR's persistently <60 mL/min signify possible Chronic Kidney Disease.   Georgiann Hahn gap 06/24/2017 8  3 - 11 Final   Performed at [facility], [place] [address] [place], [place] [address]  . WBC 06/24/2017 5.3  3.9 - 10.3 K/uL Final  . RBC 06/24/2017 4.83  3.70 - 5.45 MIL/uL Final  . Hemoglobin 06/24/2017 11.5* 11.6 - 15.9 g/dL Final  . HCT 06/24/2017 36.3  34.8 - 46.6 % Final  . MCV 06/24/2017 75.1* 79.5 - 101.0 fL Final  . MCH 06/24/2017 23.7* 25.1 - 34.0 pg Final  . MCHC 06/24/2017 31.6  31.5 - 36.0 g/dL Final  . RDW 06/24/2017 18.9* 11.2 - 14.5 % Final  . Platelets 06/24/2017 422* 145 - 400 K/uL Final  . Neutrophils Relative % 06/24/2017 58  % Final  . Neutro Abs 06/24/2017 3.1  1.5 - 6.5 K/uL Final  . Lymphocytes Relative 06/24/2017 31  % Final  . Lymphs Abs 06/24/2017 1.7  0.9 - 3.3 K/uL Final  . Monocytes Relative 06/24/2017 6  % Final  . Monocytes Absolute 06/24/2017 0.3  0.1 - 0.9 K/uL Final  . Eosinophils Relative 06/24/2017 4  % Final  . Eosinophils Absolute 06/24/2017 0.2  0.0 - 0.5 K/uL Final  . Basophils Relative 06/24/2017 1  % Final  . Basophils Absolute 06/24/2017 0.1  0.0 - 0.1 K/uL Final   Performed at [facility], [place] [address] [place], [place] [address]       Ardath Sax, MD

## 2017-07-19 NOTE — Assessment & Plan Note (Signed)
42 y.o. 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  During previous visit, acute elevation of transaminases was noted and was attributed to preceding alcohol intake.  Since last visit to the clinic, transaminitis resolved completely.  Interval labs demonstrate improving anemia and slowly rising iron storage consistent with good compliance with oral iron intake.  Plan: -Continue ferrous sulfate 325 mg twice daily -Return to my clinic in 3 months with labs obtained 3 days prior to assess iron replenishment.

## 2017-09-14 ENCOUNTER — Telehealth: Payer: Self-pay | Admitting: Hematology and Oncology

## 2017-09-14 NOTE — Telephone Encounter (Signed)
Called pt re appt being moved due to pal - left vm for pt re appts.

## 2017-09-17 DIAGNOSIS — Z86711 Personal history of pulmonary embolism: Secondary | ICD-10-CM | POA: Diagnosis not present

## 2017-09-17 DIAGNOSIS — Z9109 Other allergy status, other than to drugs and biological substances: Secondary | ICD-10-CM | POA: Diagnosis not present

## 2017-09-17 DIAGNOSIS — K5900 Constipation, unspecified: Secondary | ICD-10-CM | POA: Diagnosis not present

## 2017-09-17 DIAGNOSIS — D509 Iron deficiency anemia, unspecified: Secondary | ICD-10-CM | POA: Diagnosis not present

## 2017-09-21 ENCOUNTER — Inpatient Hospital Stay: Payer: BLUE CROSS/BLUE SHIELD | Attending: Hematology and Oncology

## 2017-09-24 ENCOUNTER — Ambulatory Visit: Payer: BLUE CROSS/BLUE SHIELD | Admitting: Hematology and Oncology

## 2017-10-01 ENCOUNTER — Inpatient Hospital Stay: Payer: BLUE CROSS/BLUE SHIELD | Admitting: Hematology and Oncology

## 2017-10-13 ENCOUNTER — Ambulatory Visit: Admit: 2017-10-13 | Payer: BLUE CROSS/BLUE SHIELD | Admitting: Obstetrics and Gynecology

## 2017-10-13 SURGERY — XI ROBOTIC ASSISTED LAPAROSCOPIC HYSTERECTOMY AND SALPINGECTOMY
Anesthesia: General | Laterality: Bilateral

## 2018-03-30 DIAGNOSIS — Z0000 Encounter for general adult medical examination without abnormal findings: Secondary | ICD-10-CM | POA: Diagnosis not present

## 2018-03-30 DIAGNOSIS — Z86711 Personal history of pulmonary embolism: Secondary | ICD-10-CM | POA: Diagnosis not present

## 2018-03-30 DIAGNOSIS — M25561 Pain in right knee: Secondary | ICD-10-CM | POA: Diagnosis not present

## 2018-03-30 DIAGNOSIS — K219 Gastro-esophageal reflux disease without esophagitis: Secondary | ICD-10-CM | POA: Diagnosis not present

## 2018-03-30 DIAGNOSIS — Z23 Encounter for immunization: Secondary | ICD-10-CM | POA: Diagnosis not present

## 2018-03-31 DIAGNOSIS — Z862 Personal history of diseases of the blood and blood-forming organs and certain disorders involving the immune mechanism: Secondary | ICD-10-CM | POA: Diagnosis not present

## 2018-03-31 DIAGNOSIS — Z13220 Encounter for screening for lipoid disorders: Secondary | ICD-10-CM | POA: Diagnosis not present

## 2018-03-31 DIAGNOSIS — Z0000 Encounter for general adult medical examination without abnormal findings: Secondary | ICD-10-CM | POA: Diagnosis not present

## 2018-05-17 DIAGNOSIS — Z01411 Encounter for gynecological examination (general) (routine) with abnormal findings: Secondary | ICD-10-CM | POA: Diagnosis not present

## 2018-05-17 DIAGNOSIS — Z1239 Encounter for other screening for malignant neoplasm of breast: Secondary | ICD-10-CM | POA: Diagnosis not present

## 2018-05-17 DIAGNOSIS — Z3040 Encounter for surveillance of contraceptives, unspecified: Secondary | ICD-10-CM | POA: Diagnosis not present

## 2018-05-17 DIAGNOSIS — Z6839 Body mass index (BMI) 39.0-39.9, adult: Secondary | ICD-10-CM | POA: Diagnosis not present

## 2018-10-14 ENCOUNTER — Emergency Department (HOSPITAL_COMMUNITY)
Admission: EM | Admit: 2018-10-14 | Discharge: 2018-10-15 | Disposition: A | Discharge status: Home / Self Care | Payer: BLUE CROSS/BLUE SHIELD | Attending: Emergency Medicine | Admitting: Emergency Medicine

## 2018-10-14 ENCOUNTER — Other Ambulatory Visit: Payer: Self-pay

## 2018-10-14 DIAGNOSIS — Z87891 Personal history of nicotine dependence: Secondary | ICD-10-CM | POA: Diagnosis not present

## 2018-10-14 DIAGNOSIS — R110 Nausea: Secondary | ICD-10-CM | POA: Insufficient documentation

## 2018-10-14 DIAGNOSIS — Z79899 Other long term (current) drug therapy: Secondary | ICD-10-CM | POA: Insufficient documentation

## 2018-10-14 DIAGNOSIS — H53149 Visual discomfort, unspecified: Secondary | ICD-10-CM | POA: Insufficient documentation

## 2018-10-14 DIAGNOSIS — H1132 Conjunctival hemorrhage, left eye: Secondary | ICD-10-CM | POA: Diagnosis not present

## 2018-10-14 DIAGNOSIS — Z7901 Long term (current) use of anticoagulants: Secondary | ICD-10-CM | POA: Diagnosis not present

## 2018-10-14 DIAGNOSIS — G43909 Migraine, unspecified, not intractable, without status migrainosus: Secondary | ICD-10-CM | POA: Diagnosis not present

## 2018-10-14 DIAGNOSIS — G43009 Migraine without aura, not intractable, without status migrainosus: Secondary | ICD-10-CM | POA: Insufficient documentation

## 2018-10-14 DIAGNOSIS — R51 Headache: Secondary | ICD-10-CM | POA: Diagnosis not present

## 2018-10-14 DIAGNOSIS — H1131 Conjunctival hemorrhage, right eye: Secondary | ICD-10-CM | POA: Diagnosis not present

## 2018-10-14 NOTE — ED Triage Notes (Signed)
Was at PCP earlier when physician suggested come to ED for headache, r eye injury with dishcarge and sensitivity to light. Pt is on blood thinners.

## 2018-10-15 ENCOUNTER — Emergency Department (HOSPITAL_COMMUNITY): Payer: BLUE CROSS/BLUE SHIELD

## 2018-10-15 ENCOUNTER — Encounter (HOSPITAL_COMMUNITY): Payer: Self-pay | Admitting: Radiology

## 2018-10-15 DIAGNOSIS — R51 Headache: Secondary | ICD-10-CM | POA: Diagnosis not present

## 2018-10-15 LAB — BASIC METABOLIC PANEL
Anion gap: 12 (ref 5–15)
BUN: 13 mg/dL (ref 6–20)
CO2: 19 mmol/L — ABNORMAL LOW (ref 22–32)
Calcium: 9 mg/dL (ref 8.9–10.3)
Chloride: 107 mmol/L (ref 98–111)
Creatinine, Ser: 0.92 mg/dL (ref 0.44–1.00)
GFR calc Af Amer: >60 mL/min (ref >60)
GFR calc non Af Amer: >60 mL/min (ref >60)
Glucose, Bld: 100 mg/dL — ABNORMAL HIGH (ref 70–99)
Potassium: 4 mmol/L (ref 3.5–5.1)
Sodium: 138 mmol/L (ref 135–145)

## 2018-10-15 LAB — CBC
HCT: 42.3 % (ref 36.0–46.0)
Hemoglobin: 13.3 g/dL (ref 12.0–15.0)
MCH: 26.9 pg (ref 26.0–34.0)
MCHC: 31.4 g/dL (ref 30.0–36.0)
MCV: 85.5 fL (ref 80.0–100.0)
Platelets: 345 10*3/uL (ref 150–400)
RBC: 4.95 MIL/uL (ref 3.87–5.11)
RDW: 15.6 % — ABNORMAL HIGH (ref 11.5–15.5)
WBC: 6.1 10*3/uL (ref 4.0–10.5)
nRBC: 0 % (ref 0.0–0.2)

## 2018-10-15 LAB — I-STAT BETA HCG BLOOD, ED (MC, WL, AP ONLY): I-stat hCG, quantitative: <5 m[IU]/mL (ref <5)

## 2018-10-15 MED ORDER — TETRACAINE HCL 0.5 % OP SOLN
2.0000 [drp] | Freq: Once | OPHTHALMIC | Status: AC
  Administered 2018-10-15: 2 [drp] via OPHTHALMIC
  Filled 2018-10-15: qty 4

## 2018-10-15 MED ORDER — DIPHENHYDRAMINE HCL 50 MG/ML IJ SOLN
25.0000 mg | Freq: Once | INTRAMUSCULAR | Status: AC
  Administered 2018-10-15: 25 mg via INTRAVENOUS
  Filled 2018-10-15: qty 1

## 2018-10-15 MED ORDER — FLUORESCEIN SODIUM 1 MG OP STRP
1.0000 | ORAL_STRIP | Freq: Once | OPHTHALMIC | Status: AC
  Administered 2018-10-15: 1 via OPHTHALMIC
  Filled 2018-10-15: qty 1

## 2018-10-15 MED ORDER — METOCLOPRAMIDE HCL 5 MG/ML IJ SOLN
10.0000 mg | Freq: Once | INTRAMUSCULAR | Status: AC
  Administered 2018-10-15: 10 mg via INTRAVENOUS
  Filled 2018-10-15: qty 2

## 2018-10-15 MED ORDER — SODIUM CHLORIDE 0.9 % IV BOLUS (SEPSIS)
1000.0000 mL | Freq: Once | INTRAVENOUS | Status: AC
  Administered 2018-10-15: 1000 mL via INTRAVENOUS

## 2018-10-15 NOTE — ED Provider Notes (Signed)
TIME SEEN: 12:30 AM  CHIEF COMPLAINT: Headache, subconjunctival hemorrhage  HPI: Patient is a 43 year old female with history of PE on Xarelto who presents to the emergency department with complaints of subconjunctival hemorrhage to the right eye that she has had for several days and now 1 day of left-sided headache that she describes as a throbbing pain that is worse with lights and sounds.  States she went to [facility] tonight and they sent her here to the emergency department for further evaluation.  She denies previous history of migraine headaches or chronic headaches.  No head injury.  No numbness, tingling or focal weakness.  Has had some nausea but no vomiting.  No injury to the eye.  No vision changes.  No vision loss or diplopia.  She denies any eye pain.  No recent vomiting, straining.  No history of alcohol abuse.  States she has had photophobia.  ROS: See HPI Constitutional: no fever  Eyes: no drainage  ENT: no runny nose   Cardiovascular:  no chest pain  Resp: no SOB  GI: no vomiting GU: no dysuria Integumentary: no rash  Allergy: no hives  Musculoskeletal: no leg swelling  Neurological: no slurred speech ROS otherwise negative  PAST MEDICAL HISTORY/PAST SURGICAL HISTORY:  Past Medical History:  Diagnosis Date  . Acute meniscal tear, lateral RIGHT  . Endometriosis   . Knee swelling, right     MEDICATIONS:  Prior to Admission medications   Medication Sig Start Date End Date Taking? Authorizing Provider  ferrous sulfate 325 (65 FE) MG tablet Take 325 mg by mouth daily with breakfast.    [provider]  Multiple Vitamin (MULTIVITAMIN) tablet Take 1 tablet by mouth daily. Alive    [provider]  pantoprazole (PROTONIX) 40 MG tablet Take 1 tablet (40 mg total) by mouth daily. 02/17/17   Hosie Poisson, MD  polyethylene glycol (MIRALAX / GLYCOLAX) packet Take 17 g by mouth daily as needed. 02/17/17   Hosie Poisson, MD  rivaroxaban (XARELTO) 20  MG TABS tablet Take 1 tablet (20 mg total) by mouth daily with supper. 03/10/17   Hosie Poisson, MD    ALLERGIES:  No Known Allergies  SOCIAL HISTORY:  Social History   Tobacco Use  . Smoking status: Former Smoker    Packs/day: 0.25    Years: 3.00    Pack years: 0.75    Last attempt to quit: 02/16/2017    Years since quitting: 1.6  . Smokeless tobacco: Never Used  Substance Use Topics  . Alcohol use: Yes    Comment: OCCASIONAL on weekends    FAMILY HISTORY: Family History  Problem Relation Age of Onset  . Bleeding Disorder Maternal Grandmother   . Deep vein thrombosis Neg Hx   . Pulmonary embolism Neg Hx     EXAM: BP 123/77   Pulse 64   Temp 98.7 F (37.1 C) (Oral)   Resp 16   SpO2 98%  CONSTITUTIONAL: Alert and oriented and responds appropriately to questions. Well-appearing; well-nourished HEAD: Normocephalic atraumatic, no temporal tenderness on exam, no facial warmth or swelling or redness  EYES: She has a subconjunctival hemorrhage to the right eye.  There is no hyphema or hypopyon.  Conjunctiva of the left eye appears normal.  Extraocular movements are intact.  Pupils are approximately 4 mm and equal and reactive bilaterally.  She has no pain with consensual light response.  Has photophobia bilaterally.  No fluorescein uptake in either eye.  Intraocular pressure in the left  eye is 20 mmHg, intraocular pressure of the right eye is 18 mmHg. Visual Acuity  Bilateral Distance: 20/25  R Distance: 20/25  L Distance: 20/32  ENT: normal nose; moist mucous membranes NECK: Supple, no meningismus, no nuchal rigidity, no LAD  CARD: RRR; S1 and S2 appreciated; no murmurs, no clicks, no rubs, no gallops RESP: Normal chest excursion without splinting or tachypnea; breath sounds clear and equal bilaterally; no wheezes, no rhonchi, no rales, no hypoxia or respiratory distress, speaking full sentences ABD/GI: Normal bowel sounds; non-distended; soft, non-tender, no rebound, no  guarding, no peritoneal signs, no hepatosplenomegaly BACK:  The back appears normal and is non-tender to palpation, there is no CVA tenderness EXT: Normal ROM in all joints; non-tender to palpation; no edema; normal capillary refill; no cyanosis, no calf tenderness or swelling    SKIN: Normal color for age and race; warm; no rash NEURO: Moves all extremities equally, sensation to light touch intact diffusely, cranial nerves II through XII intact, normal speech, strength 5/5 in all 4 extremities PSYCH: The patient's mood and manner are appropriate. Grooming and personal hygiene are appropriate.  MEDICAL DECISION MAKING: Patient here with complaints of a left-sided headache.  Seems to be a migraine headache but patient sent here by walk-in clinic for concerns for new headache while on Xarelto.  I have low suspicion that this is intracranial hemorrhage but will obtain CT of the head.  She does not describe this as the worst headache of her life.  States that she woke up with this headache.  She has no focal neurologic deficits to suggest stroke.  No fever or meningismus to suggest meningitis or encephalitis.  Will treat with Reglan, IV fluids, Benadryl.  As for her right eye, this appears to be a spontaneous subconjunctival hemorrhage.  No sign of infection on exam.  No vision changes.  Discussed with patient that this may be completely unrelated.  ED PROGRESS: Patient's labs, head CT unremarkable.  Her eye exam continues to be normal.  I suspect that this is a migraine headache.  Headache exam is completely resolved after Reglan, Benadryl and IV fluids.  She declines any further medications and states she is ready for discharge home.  We will have her follow-up with her PCP as an outpatient.   At this time, I do not feel there is any life-threatening condition present. I have reviewed and discussed all results (EKG, imaging, lab, urine as appropriate) and exam findings with patient/family. I have reviewed  nursing notes and appropriate previous records.  I feel the patient is safe to be discharged home without further emergent workup and can continue workup as an outpatient as needed. Discussed usual and customary return precautions. Patient/family verbalize understanding and are comfortable with this plan.  Outpatient follow-up has been provided as needed. All questions have been answered.      Jartavious Mckimmy, Delice Bison, DO 10/15/18 [phone]

## 2018-10-15 NOTE — ED Notes (Signed)
Pt transported to CT ?

## 2018-10-15 NOTE — ED Notes (Signed)
Patient verbalizes understanding of discharge instructions. Opportunity for questioning and answers were provided. Armband removed by staff, pt discharged from ED ambulatory.   

## 2018-10-15 NOTE — Discharge Instructions (Addendum)
You may alternate Tylenol 1000 mg every 6 hours as needed for pain and Ibuprofen 800 mg every 8 hours as needed for pain.  Please take Ibuprofen with food. ° °

## 2019-02-04 IMAGING — CR DG CHEST 2V
2 series · 2 of 2 positions shown · non-contrast
Comparison: None.

CLINICAL DATA: Dyspnea and productive cough with mid right-sided
chest pain and chills x1 month.

EXAM:
CHEST  2 VIEW

[chest lat]
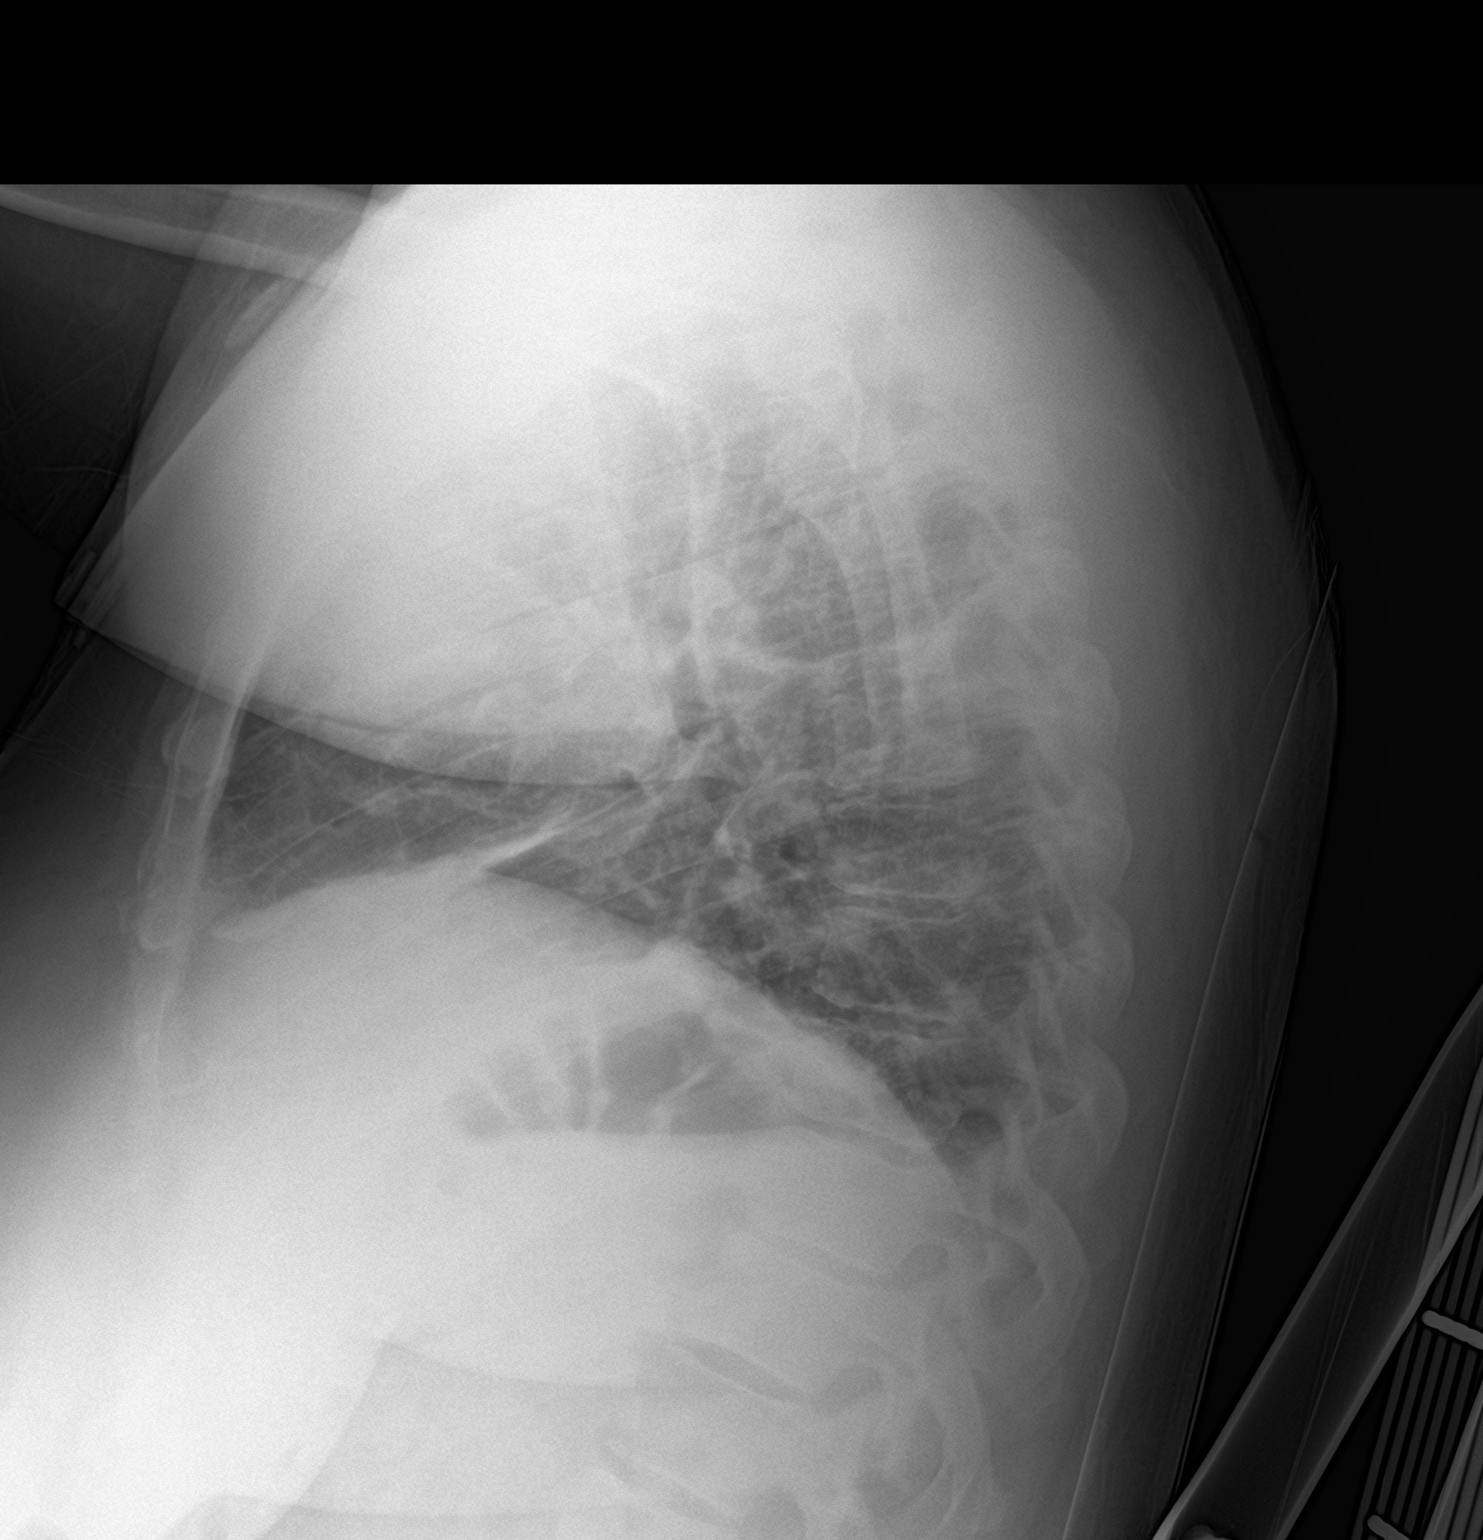

[chest ap]
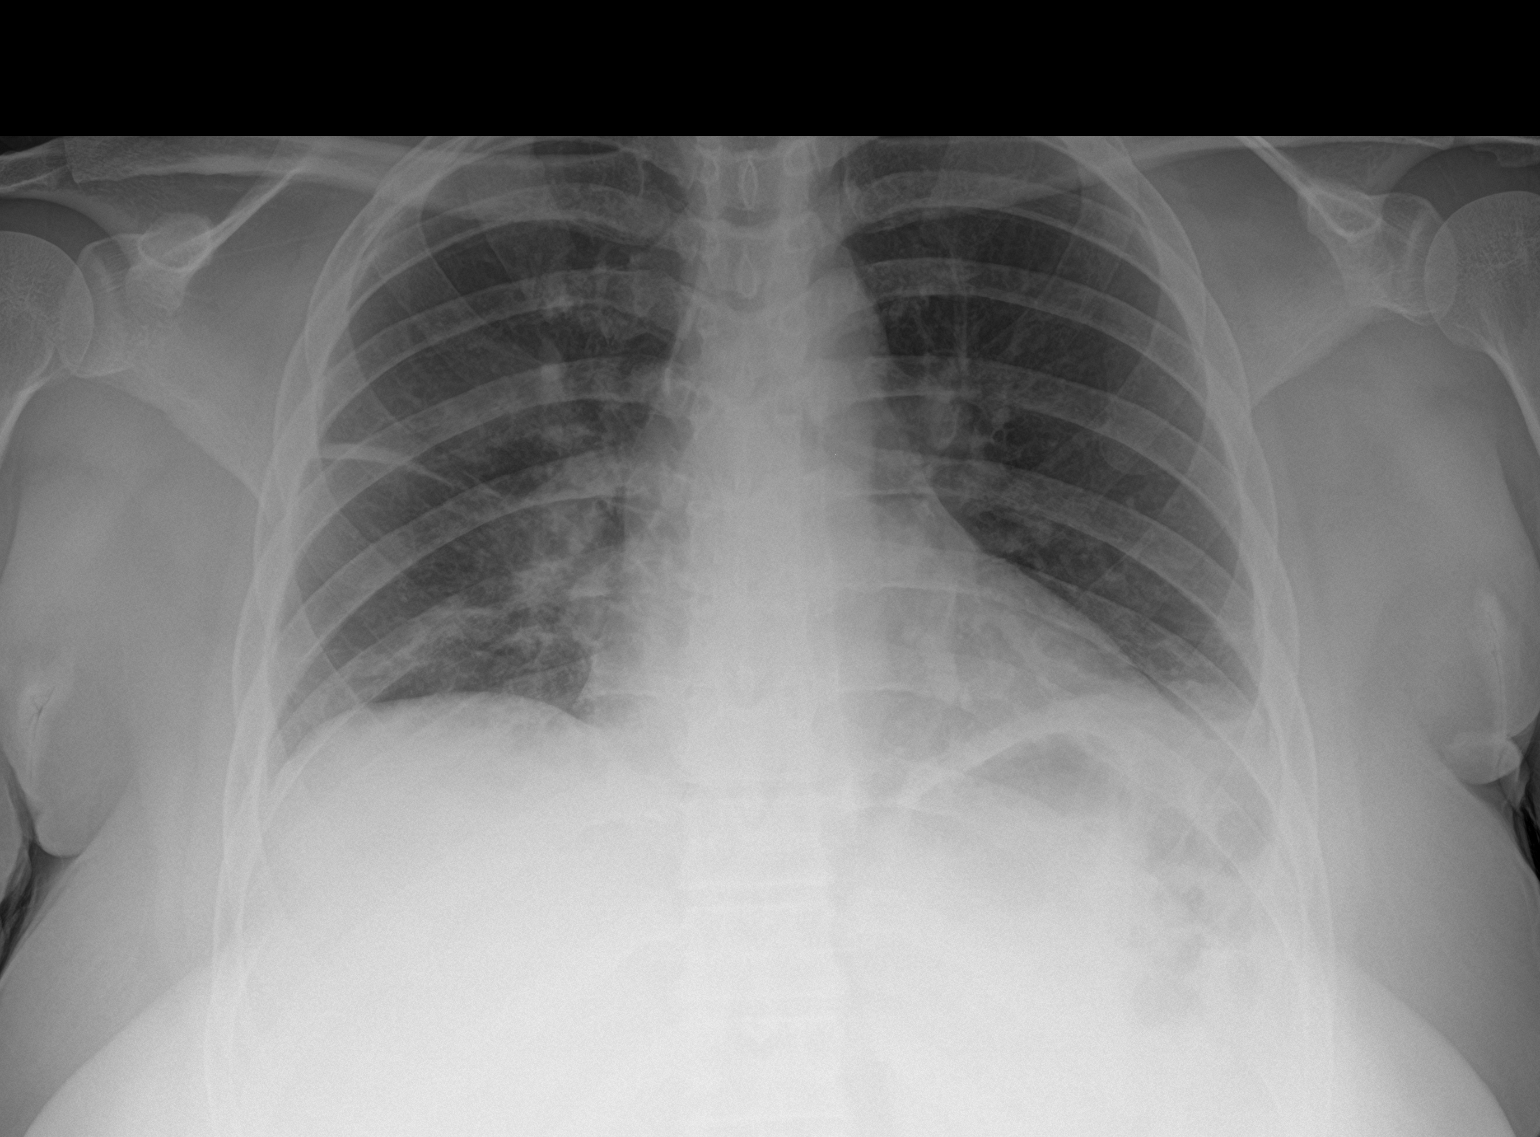

[2 of 2 positions shown; findings below may reference images not displayed]

FINDINGS: Heart is top-normal in size. No aortic aneurysm or mediastinal
widening. Mild central vascular congestion is seen with small left
effusion and trace fluid in the right major fissure no acute
pneumonic consolidation. No pneumothorax. No acute nor suspicious
osseous abnormalities.
IMPRESSION: Mild central vascular congestion with fluid in the right major
fissure and at the left lung base. No acute pneumonic consolidation.

## 2019-02-05 IMAGING — CT CT ANGIO CHEST
3 of 7 series · 18 of 36 positions shown · IV contrast (isovue)
Comparison: None.

CLINICAL DATA: 40 y/o F; shortness of breath. PE suspected, high
pretest probability.

EXAM:
CT ANGIOGRAPHY CHEST WITH CONTRAST
TECHNIQUE: Multidetector CT imaging of the chest was performed using the
standard protocol during bolus administration of intravenous
contrast. Multiplanar CT image reconstructions and MIPs were
obtained to evaluate the vascular anatomy.
CONTRAST:  100 cc Isovue 370

[Series 7: pe lung · axial · 0.78mm/px · z∈[+1323,+1491]mm · 5 of 128 slices shown]
[im 22/128  mediastinal]
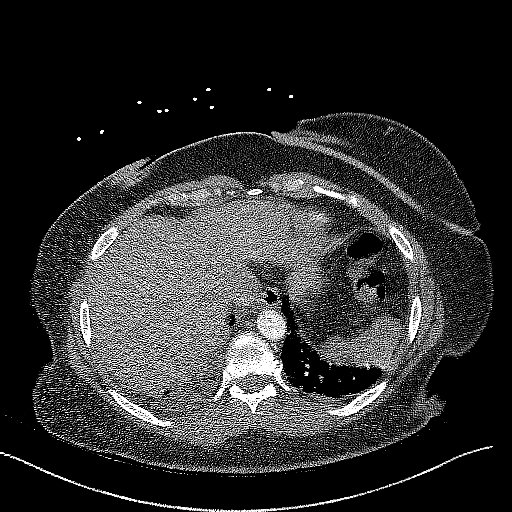
[im 43/128  mediastinal]
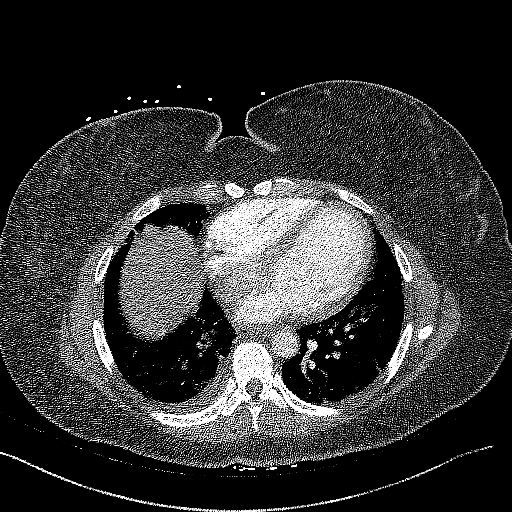
[im 64/128  mediastinal]
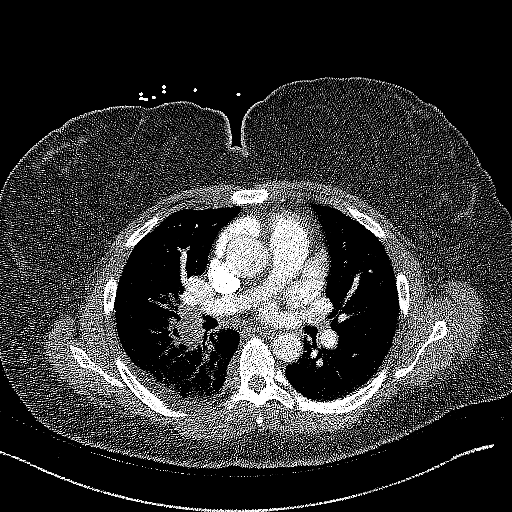
[im 85/128  mediastinal]
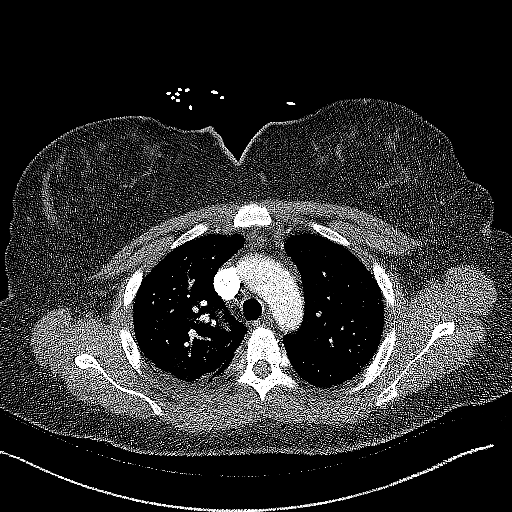
[im 106/128  mediastinal]
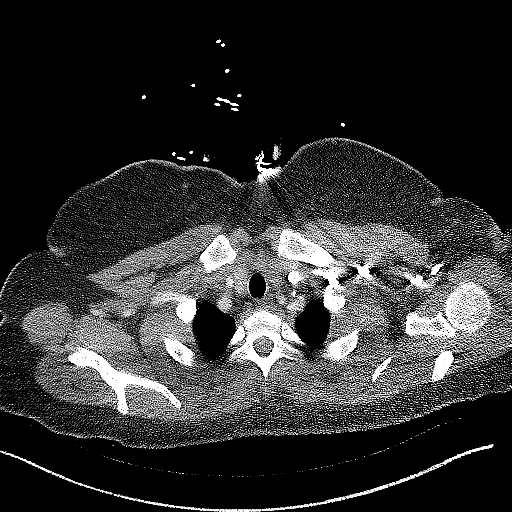

[Series 8: pe thins · axial · 0.78mm/px · z∈[+1283,+1514]mm · 12 of 273 slices shown]
[im 21/273  lung]
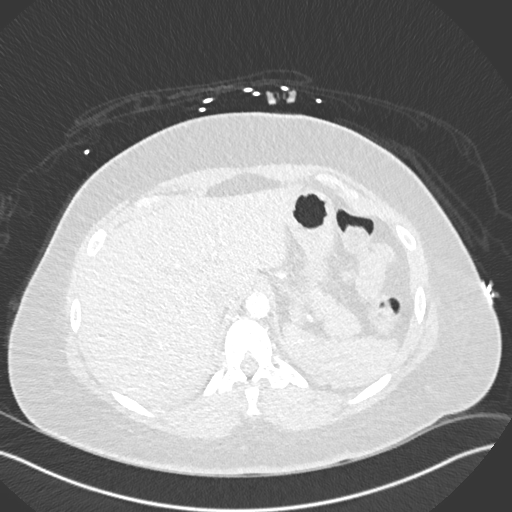
[im 42/273  mediastinal]
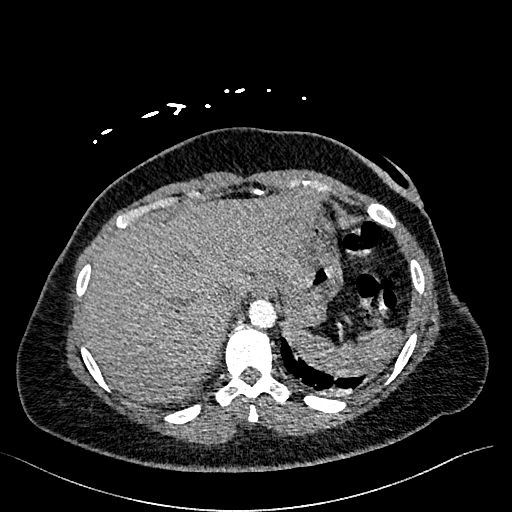
[im 63/273  lung]
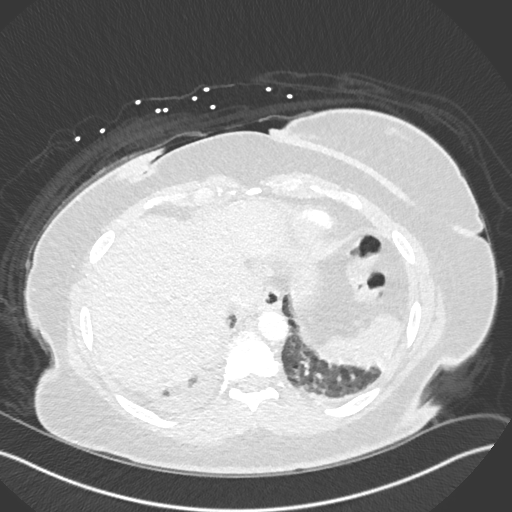
[im 84/273  mediastinal]
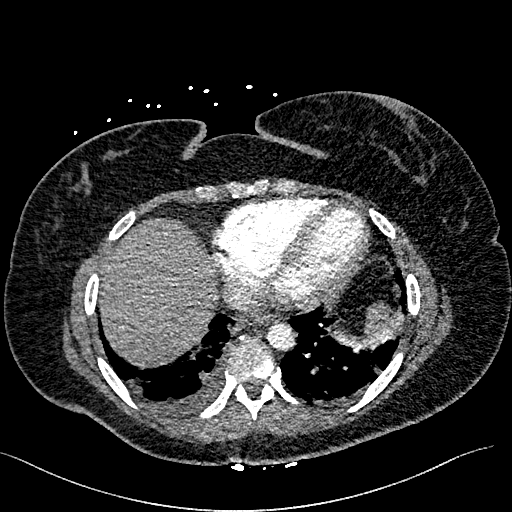
[im 105/273  lung]
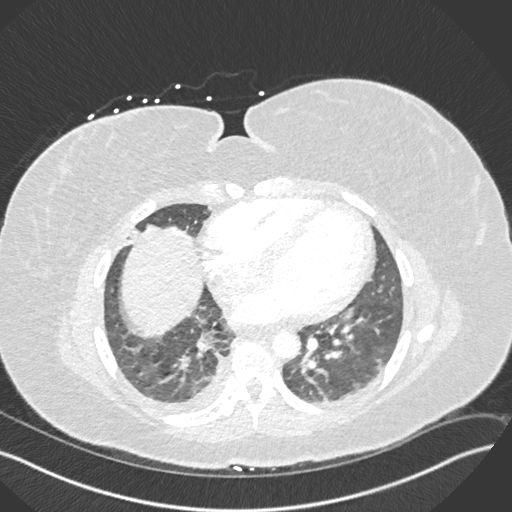
[im 126/273  mediastinal]
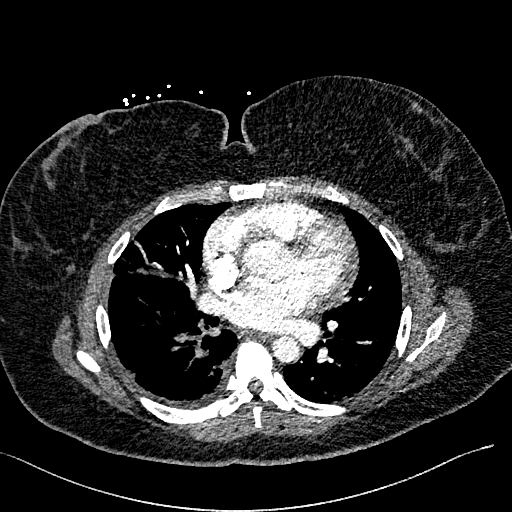
[im 147/273  lung]
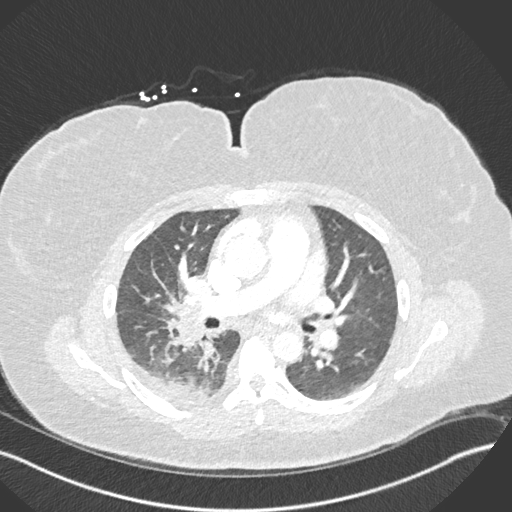
[im 168/273  mediastinal]
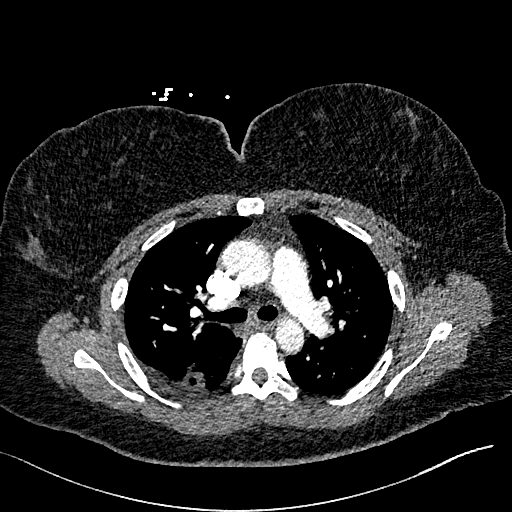
[im 189/273  lung]
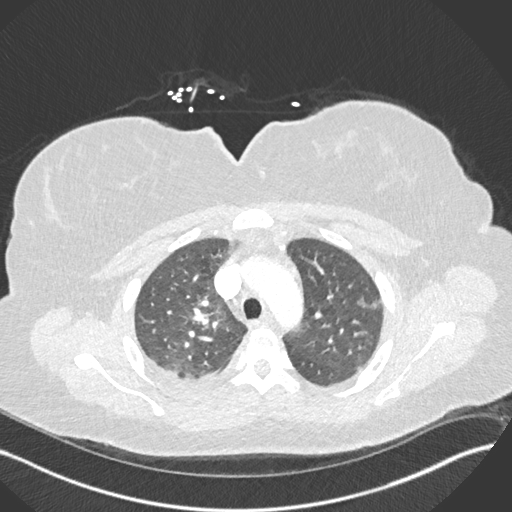
[im 210/273  mediastinal]
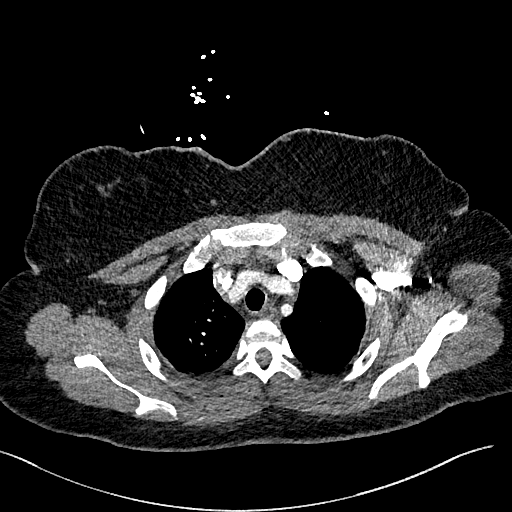
[im 231/273  lung]
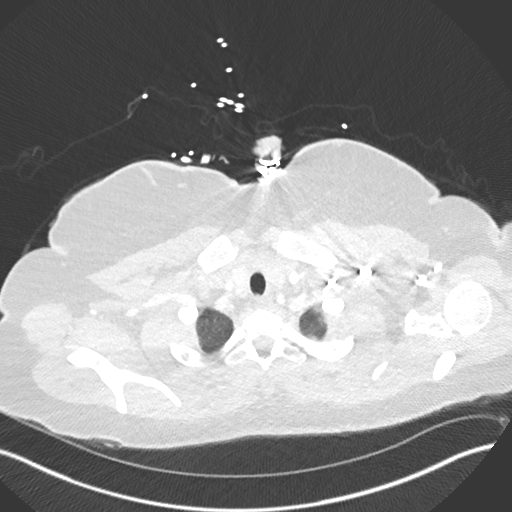
[im 252/273  mediastinal]
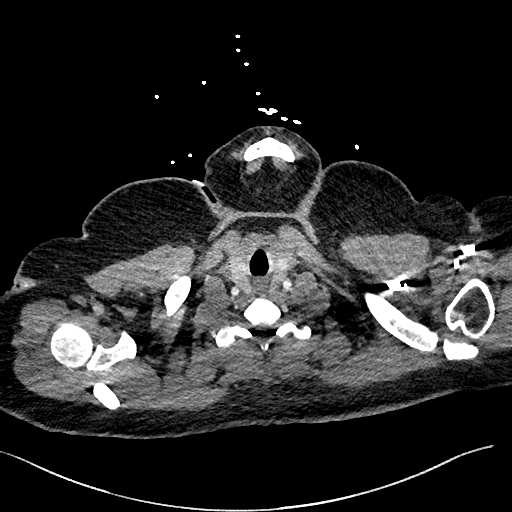

[Series 9: pe 2mm cor · coronal · 0.59mm/px · 1 of 157 slices shown]
[im 79/157  mediastinal]
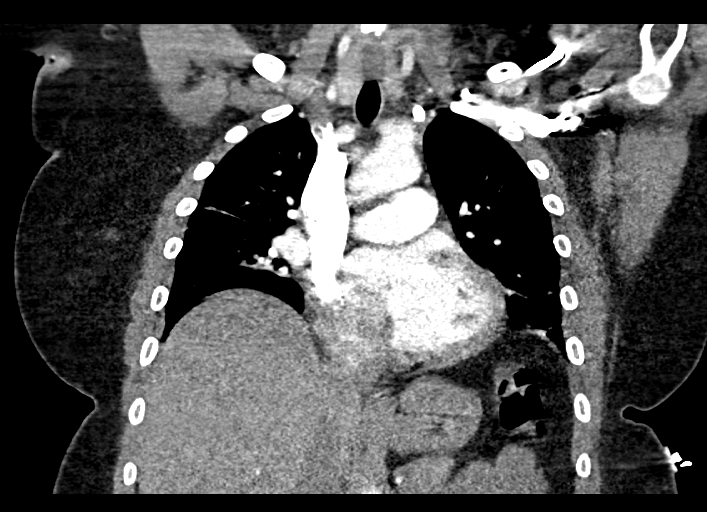

[18 of 36 positions shown; findings below may reference images not displayed]

FINDINGS: Cardiovascular: Large right-sided pulmonary embolus involving right
main pulmonary artery, right lower lobe pulmonary artery, multiple
segmental pulmonary arteries. RV/LV equals 0.8.

Normal heart size.  No pericardial.  Normal caliber thoracic aorta.

Mediastinum/Nodes: No enlarged mediastinal, hilar, or axillary lymph
nodes. Thyroid gland, trachea, and esophagus demonstrate no
significant findings.

Lungs/Pleura: Small bilateral pleural effusions. Peripheral
consolidation in the right upper lobe may represent a pulmonary
infarction (series 8, image 111).

Upper Abdomen: No acute abnormality.

Musculoskeletal: No chest wall abnormality. No acute or significant
osseous findings.

Review of the MIP images confirms the above findings.
IMPRESSION: 1. Large right-sided acute pulmonary embolus. No findings of right
heart strain.
2. Small right upper lobe peripheral consolidation may represent
pulmonary infarction.
3. Small bilateral pleural effusions.
These results were called by telephone at the time of interpretation
on 02/15/2017 at [DATE] to Dr. MIUSA DRAR , who verbally
acknowledged these results.

By: Quirijn Amazigh M.D.

## 2019-04-08 DIAGNOSIS — D6869 Other thrombophilia: Secondary | ICD-10-CM | POA: Diagnosis not present

## 2019-04-08 DIAGNOSIS — Z0000 Encounter for general adult medical examination without abnormal findings: Secondary | ICD-10-CM | POA: Diagnosis not present

## 2019-04-08 DIAGNOSIS — Z86718 Personal history of other venous thrombosis and embolism: Secondary | ICD-10-CM | POA: Diagnosis not present

## 2019-04-08 DIAGNOSIS — D509 Iron deficiency anemia, unspecified: Secondary | ICD-10-CM | POA: Diagnosis not present

## 2019-04-08 DIAGNOSIS — E782 Mixed hyperlipidemia: Secondary | ICD-10-CM | POA: Diagnosis not present

## 2019-04-08 DIAGNOSIS — R609 Edema, unspecified: Secondary | ICD-10-CM | POA: Diagnosis not present

## 2019-04-08 DIAGNOSIS — K219 Gastro-esophageal reflux disease without esophagitis: Secondary | ICD-10-CM | POA: Diagnosis not present

## 2019-04-08 DIAGNOSIS — Z86711 Personal history of pulmonary embolism: Secondary | ICD-10-CM | POA: Diagnosis not present

## 2019-10-12 DIAGNOSIS — D6869 Other thrombophilia: Secondary | ICD-10-CM | POA: Diagnosis not present

## 2019-10-12 DIAGNOSIS — D473 Essential (hemorrhagic) thrombocythemia: Secondary | ICD-10-CM | POA: Diagnosis not present

## 2019-10-12 DIAGNOSIS — Z86711 Personal history of pulmonary embolism: Secondary | ICD-10-CM | POA: Diagnosis not present

## 2019-10-12 DIAGNOSIS — K219 Gastro-esophageal reflux disease without esophagitis: Secondary | ICD-10-CM | POA: Diagnosis not present

## 2019-12-08 DIAGNOSIS — M19072 Primary osteoarthritis, left ankle and foot: Secondary | ICD-10-CM | POA: Diagnosis not present

## 2019-12-08 DIAGNOSIS — Q667 Congenital pes cavus, unspecified foot: Secondary | ICD-10-CM | POA: Diagnosis not present

## 2019-12-08 DIAGNOSIS — Q688 Other specified congenital musculoskeletal deformities: Secondary | ICD-10-CM | POA: Diagnosis not present

## 2019-12-08 DIAGNOSIS — M79672 Pain in left foot: Secondary | ICD-10-CM | POA: Diagnosis not present

## 2020-03-08 DIAGNOSIS — Z20822 Contact with and (suspected) exposure to covid-19: Secondary | ICD-10-CM | POA: Diagnosis not present

## 2020-05-24 DIAGNOSIS — Z20822 Contact with and (suspected) exposure to covid-19: Secondary | ICD-10-CM | POA: Diagnosis not present

## 2020-10-18 DIAGNOSIS — Z7901 Long term (current) use of anticoagulants: Secondary | ICD-10-CM | POA: Diagnosis not present

## 2020-10-18 DIAGNOSIS — Z113 Encounter for screening for infections with a predominantly sexual mode of transmission: Secondary | ICD-10-CM | POA: Diagnosis not present

## 2020-10-18 DIAGNOSIS — Z0000 Encounter for general adult medical examination without abnormal findings: Secondary | ICD-10-CM | POA: Diagnosis not present

## 2020-10-18 DIAGNOSIS — D259 Leiomyoma of uterus, unspecified: Secondary | ICD-10-CM | POA: Diagnosis not present

## 2020-10-18 DIAGNOSIS — Z01411 Encounter for gynecological examination (general) (routine) with abnormal findings: Secondary | ICD-10-CM | POA: Diagnosis not present

## 2020-10-18 DIAGNOSIS — Z1231 Encounter for screening mammogram for malignant neoplasm of breast: Secondary | ICD-10-CM | POA: Diagnosis not present

## 2020-10-18 DIAGNOSIS — Z3040 Encounter for surveillance of contraceptives, unspecified: Secondary | ICD-10-CM | POA: Diagnosis not present

## 2020-10-18 DIAGNOSIS — N920 Excessive and frequent menstruation with regular cycle: Secondary | ICD-10-CM | POA: Diagnosis not present

## 2020-10-18 DIAGNOSIS — Z6841 Body Mass Index (BMI) 40.0 and over, adult: Secondary | ICD-10-CM | POA: Diagnosis not present

## 2020-11-05 DIAGNOSIS — N920 Excessive and frequent menstruation with regular cycle: Secondary | ICD-10-CM | POA: Diagnosis not present

## 2020-11-05 DIAGNOSIS — E7800 Pure hypercholesterolemia, unspecified: Secondary | ICD-10-CM | POA: Diagnosis not present

## 2020-11-05 DIAGNOSIS — D259 Leiomyoma of uterus, unspecified: Secondary | ICD-10-CM | POA: Diagnosis not present

## 2020-11-05 DIAGNOSIS — E559 Vitamin D deficiency, unspecified: Secondary | ICD-10-CM | POA: Diagnosis not present

## 2020-11-08 DIAGNOSIS — Z30430 Encounter for insertion of intrauterine contraceptive device: Secondary | ICD-10-CM | POA: Diagnosis not present

## 2020-11-08 DIAGNOSIS — Z113 Encounter for screening for infections with a predominantly sexual mode of transmission: Secondary | ICD-10-CM | POA: Diagnosis not present

## 2020-11-08 DIAGNOSIS — D259 Leiomyoma of uterus, unspecified: Secondary | ICD-10-CM | POA: Diagnosis not present

## 2020-11-08 DIAGNOSIS — N920 Excessive and frequent menstruation with regular cycle: Secondary | ICD-10-CM | POA: Diagnosis not present

## 2020-11-21 DIAGNOSIS — Z30431 Encounter for routine checking of intrauterine contraceptive device: Secondary | ICD-10-CM | POA: Diagnosis not present

## 2021-01-07 DIAGNOSIS — D6869 Other thrombophilia: Secondary | ICD-10-CM | POA: Diagnosis not present

## 2021-01-07 DIAGNOSIS — K219 Gastro-esophageal reflux disease without esophagitis: Secondary | ICD-10-CM | POA: Diagnosis not present

## 2021-01-07 DIAGNOSIS — M62838 Other muscle spasm: Secondary | ICD-10-CM | POA: Diagnosis not present

## 2021-07-01 DIAGNOSIS — M25561 Pain in right knee: Secondary | ICD-10-CM | POA: Diagnosis not present

## 2021-07-01 DIAGNOSIS — S83241A Other tear of medial meniscus, current injury, right knee, initial encounter: Secondary | ICD-10-CM | POA: Diagnosis not present

## 2021-07-08 DIAGNOSIS — M25561 Pain in right knee: Secondary | ICD-10-CM | POA: Diagnosis not present

## 2021-07-15 DIAGNOSIS — S83281A Other tear of lateral meniscus, current injury, right knee, initial encounter: Secondary | ICD-10-CM | POA: Diagnosis not present

## 2021-07-23 DIAGNOSIS — D509 Iron deficiency anemia, unspecified: Secondary | ICD-10-CM | POA: Diagnosis not present

## 2021-07-23 DIAGNOSIS — Z86711 Personal history of pulmonary embolism: Secondary | ICD-10-CM | POA: Diagnosis not present

## 2021-07-23 DIAGNOSIS — D6869 Other thrombophilia: Secondary | ICD-10-CM | POA: Diagnosis not present

## 2021-07-23 DIAGNOSIS — Z86718 Personal history of other venous thrombosis and embolism: Secondary | ICD-10-CM | POA: Diagnosis not present

## 2021-08-15 DIAGNOSIS — Y999 Unspecified external cause status: Secondary | ICD-10-CM | POA: Diagnosis not present

## 2021-08-15 DIAGNOSIS — S83271A Complex tear of lateral meniscus, current injury, right knee, initial encounter: Secondary | ICD-10-CM | POA: Diagnosis not present

## 2021-08-15 DIAGNOSIS — G8918 Other acute postprocedural pain: Secondary | ICD-10-CM | POA: Diagnosis not present

## 2021-08-15 DIAGNOSIS — M94261 Chondromalacia, right knee: Secondary | ICD-10-CM | POA: Diagnosis not present

## 2021-08-23 DIAGNOSIS — M25561 Pain in right knee: Secondary | ICD-10-CM | POA: Diagnosis not present

## 2021-08-26 DIAGNOSIS — M25561 Pain in right knee: Secondary | ICD-10-CM | POA: Diagnosis not present

## 2021-08-29 DIAGNOSIS — M25561 Pain in right knee: Secondary | ICD-10-CM | POA: Diagnosis not present

## 2021-09-02 DIAGNOSIS — M25561 Pain in right knee: Secondary | ICD-10-CM | POA: Diagnosis not present

## 2021-09-05 DIAGNOSIS — M25561 Pain in right knee: Secondary | ICD-10-CM | POA: Diagnosis not present

## 2021-09-10 DIAGNOSIS — M25561 Pain in right knee: Secondary | ICD-10-CM | POA: Diagnosis not present

## 2021-09-13 DIAGNOSIS — M25561 Pain in right knee: Secondary | ICD-10-CM | POA: Diagnosis not present

## 2021-09-16 DIAGNOSIS — M25561 Pain in right knee: Secondary | ICD-10-CM | POA: Diagnosis not present

## 2021-09-19 DIAGNOSIS — M25561 Pain in right knee: Secondary | ICD-10-CM | POA: Diagnosis not present

## 2021-09-30 DIAGNOSIS — Z0000 Encounter for general adult medical examination without abnormal findings: Secondary | ICD-10-CM | POA: Diagnosis not present

## 2021-09-30 DIAGNOSIS — D6869 Other thrombophilia: Secondary | ICD-10-CM | POA: Diagnosis not present

## 2021-09-30 DIAGNOSIS — D509 Iron deficiency anemia, unspecified: Secondary | ICD-10-CM | POA: Diagnosis not present

## 2021-09-30 DIAGNOSIS — K219 Gastro-esophageal reflux disease without esophagitis: Secondary | ICD-10-CM | POA: Diagnosis not present

## 2021-09-30 DIAGNOSIS — E782 Mixed hyperlipidemia: Secondary | ICD-10-CM | POA: Diagnosis not present

## 2021-10-03 DIAGNOSIS — N858 Other specified noninflammatory disorders of uterus: Secondary | ICD-10-CM | POA: Diagnosis not present

## 2021-10-03 DIAGNOSIS — Z86711 Personal history of pulmonary embolism: Secondary | ICD-10-CM | POA: Diagnosis not present

## 2021-10-03 DIAGNOSIS — N939 Abnormal uterine and vaginal bleeding, unspecified: Secondary | ICD-10-CM | POA: Diagnosis not present

## 2021-10-03 DIAGNOSIS — N946 Dysmenorrhea, unspecified: Secondary | ICD-10-CM | POA: Diagnosis not present

## 2021-10-03 DIAGNOSIS — D259 Leiomyoma of uterus, unspecified: Secondary | ICD-10-CM | POA: Diagnosis not present

## 2021-10-07 DIAGNOSIS — T8389XA Other specified complication of genitourinary prosthetic devices, implants and grafts, initial encounter: Secondary | ICD-10-CM | POA: Diagnosis not present

## 2021-10-07 DIAGNOSIS — Z86711 Personal history of pulmonary embolism: Secondary | ICD-10-CM | POA: Diagnosis not present

## 2021-10-07 DIAGNOSIS — N946 Dysmenorrhea, unspecified: Secondary | ICD-10-CM | POA: Diagnosis not present

## 2021-10-07 DIAGNOSIS — N939 Abnormal uterine and vaginal bleeding, unspecified: Secondary | ICD-10-CM | POA: Diagnosis not present

## 2021-10-07 DIAGNOSIS — D259 Leiomyoma of uterus, unspecified: Secondary | ICD-10-CM | POA: Diagnosis not present

## 2022-04-07 DIAGNOSIS — U071 COVID-19: Secondary | ICD-10-CM | POA: Diagnosis not present

## 2022-04-07 DIAGNOSIS — R0981 Nasal congestion: Secondary | ICD-10-CM | POA: Diagnosis not present

## 2022-04-07 DIAGNOSIS — R051 Acute cough: Secondary | ICD-10-CM | POA: Diagnosis not present

## 2022-04-07 DIAGNOSIS — J069 Acute upper respiratory infection, unspecified: Secondary | ICD-10-CM | POA: Diagnosis not present

## 2022-04-16 ENCOUNTER — Other Ambulatory Visit: Payer: Self-pay | Admitting: Obstetrics and Gynecology

## 2022-04-16 DIAGNOSIS — R928 Other abnormal and inconclusive findings on diagnostic imaging of breast: Secondary | ICD-10-CM

## 2022-04-17 DIAGNOSIS — Z113 Encounter for screening for infections with a predominantly sexual mode of transmission: Secondary | ICD-10-CM | POA: Diagnosis not present

## 2022-04-17 DIAGNOSIS — Z124 Encounter for screening for malignant neoplasm of cervix: Secondary | ICD-10-CM | POA: Diagnosis not present

## 2022-04-17 DIAGNOSIS — Z01419 Encounter for gynecological examination (general) (routine) without abnormal findings: Secondary | ICD-10-CM | POA: Diagnosis not present

## 2022-04-17 DIAGNOSIS — D259 Leiomyoma of uterus, unspecified: Secondary | ICD-10-CM | POA: Diagnosis not present

## 2022-04-17 DIAGNOSIS — Z1231 Encounter for screening mammogram for malignant neoplasm of breast: Secondary | ICD-10-CM | POA: Diagnosis not present

## 2022-04-17 DIAGNOSIS — N920 Excessive and frequent menstruation with regular cycle: Secondary | ICD-10-CM | POA: Diagnosis not present

## 2022-05-05 ENCOUNTER — Ambulatory Visit
Admission: RE | Admit: 2022-05-05 | Discharge: 2022-05-05 | Disposition: A | Discharge status: Home / Self Care | Payer: BC Managed Care – PPO | Source: Ambulatory Visit | Attending: Obstetrics and Gynecology | Admitting: Obstetrics and Gynecology

## 2022-05-05 ENCOUNTER — Other Ambulatory Visit: Payer: Self-pay | Admitting: Obstetrics and Gynecology

## 2022-05-05 ENCOUNTER — Ambulatory Visit
Admission: RE | Admit: 2022-05-05 | Discharge: 2022-05-05 | Disposition: A | Discharge status: Home / Self Care | Payer: BLUE CROSS/BLUE SHIELD | Source: Ambulatory Visit | Attending: Obstetrics and Gynecology | Admitting: Obstetrics and Gynecology

## 2022-05-05 DIAGNOSIS — R921 Mammographic calcification found on diagnostic imaging of breast: Secondary | ICD-10-CM | POA: Diagnosis not present

## 2022-05-05 DIAGNOSIS — R928 Other abnormal and inconclusive findings on diagnostic imaging of breast: Secondary | ICD-10-CM

## 2022-05-09 ENCOUNTER — Ambulatory Visit
Admission: RE | Admit: 2022-05-09 | Discharge: 2022-05-09 | Disposition: A | Discharge status: Home / Self Care | Payer: BC Managed Care – PPO | Source: Ambulatory Visit | Attending: Obstetrics and Gynecology | Admitting: Obstetrics and Gynecology

## 2022-05-09 DIAGNOSIS — R921 Mammographic calcification found on diagnostic imaging of breast: Secondary | ICD-10-CM | POA: Diagnosis not present

## 2022-05-09 DIAGNOSIS — N6011 Diffuse cystic mastopathy of right breast: Secondary | ICD-10-CM | POA: Diagnosis not present

## 2022-05-09 HISTORY — PX: BREAST BIOPSY: SHX20

## 2022-06-21 DIAGNOSIS — H6992 Unspecified Eustachian tube disorder, left ear: Secondary | ICD-10-CM | POA: Diagnosis not present

## 2022-06-21 DIAGNOSIS — J069 Acute upper respiratory infection, unspecified: Secondary | ICD-10-CM | POA: Diagnosis not present

## 2022-06-21 DIAGNOSIS — R062 Wheezing: Secondary | ICD-10-CM | POA: Diagnosis not present

## 2022-10-27 DIAGNOSIS — D6869 Other thrombophilia: Secondary | ICD-10-CM | POA: Diagnosis not present

## 2022-10-27 DIAGNOSIS — E782 Mixed hyperlipidemia: Secondary | ICD-10-CM | POA: Diagnosis not present

## 2022-10-27 DIAGNOSIS — Z0000 Encounter for general adult medical examination without abnormal findings: Secondary | ICD-10-CM | POA: Diagnosis not present

## 2022-10-27 DIAGNOSIS — K219 Gastro-esophageal reflux disease without esophagitis: Secondary | ICD-10-CM | POA: Diagnosis not present

## 2022-10-27 DIAGNOSIS — D509 Iron deficiency anemia, unspecified: Secondary | ICD-10-CM | POA: Diagnosis not present

## 2023-11-17 DIAGNOSIS — E559 Vitamin D deficiency, unspecified: Secondary | ICD-10-CM | POA: Diagnosis not present

## 2023-11-17 DIAGNOSIS — K219 Gastro-esophageal reflux disease without esophagitis: Secondary | ICD-10-CM | POA: Diagnosis not present

## 2023-11-17 DIAGNOSIS — Z0000 Encounter for general adult medical examination without abnormal findings: Secondary | ICD-10-CM | POA: Diagnosis not present

## 2023-11-17 DIAGNOSIS — Z86718 Personal history of other venous thrombosis and embolism: Secondary | ICD-10-CM | POA: Diagnosis not present

## 2023-11-17 DIAGNOSIS — M62838 Other muscle spasm: Secondary | ICD-10-CM | POA: Diagnosis not present

## 2023-11-19 DIAGNOSIS — E782 Mixed hyperlipidemia: Secondary | ICD-10-CM | POA: Diagnosis not present

## 2023-11-19 DIAGNOSIS — D509 Iron deficiency anemia, unspecified: Secondary | ICD-10-CM | POA: Diagnosis not present

## 2023-11-19 DIAGNOSIS — D473 Essential (hemorrhagic) thrombocythemia: Secondary | ICD-10-CM | POA: Diagnosis not present

## 2023-11-19 DIAGNOSIS — E559 Vitamin D deficiency, unspecified: Secondary | ICD-10-CM | POA: Diagnosis not present

## 2024-02-25 ENCOUNTER — Other Ambulatory Visit: Payer: Self-pay | Admitting: Obstetrics and Gynecology

## 2024-02-25 DIAGNOSIS — Z1231 Encounter for screening mammogram for malignant neoplasm of breast: Secondary | ICD-10-CM

## 2024-03-08 DIAGNOSIS — N920 Excessive and frequent menstruation with regular cycle: Secondary | ICD-10-CM | POA: Diagnosis not present

## 2024-03-08 DIAGNOSIS — Z01419 Encounter for gynecological examination (general) (routine) without abnormal findings: Secondary | ICD-10-CM | POA: Diagnosis not present

## 2024-03-08 DIAGNOSIS — D259 Leiomyoma of uterus, unspecified: Secondary | ICD-10-CM | POA: Diagnosis not present

## 2024-03-08 DIAGNOSIS — T8332XA Displacement of intrauterine contraceptive device, initial encounter: Secondary | ICD-10-CM | POA: Diagnosis not present

## 2024-03-08 DIAGNOSIS — Z1339 Encounter for screening examination for other mental health and behavioral disorders: Secondary | ICD-10-CM | POA: Diagnosis not present

## 2024-03-08 DIAGNOSIS — Z86711 Personal history of pulmonary embolism: Secondary | ICD-10-CM | POA: Diagnosis not present

## 2024-03-10 DIAGNOSIS — D259 Leiomyoma of uterus, unspecified: Secondary | ICD-10-CM | POA: Diagnosis not present

## 2024-03-10 DIAGNOSIS — N920 Excessive and frequent menstruation with regular cycle: Secondary | ICD-10-CM | POA: Diagnosis not present

## 2024-03-10 DIAGNOSIS — Z86711 Personal history of pulmonary embolism: Secondary | ICD-10-CM | POA: Diagnosis not present

## 2024-03-10 DIAGNOSIS — T8332XA Displacement of intrauterine contraceptive device, initial encounter: Secondary | ICD-10-CM | POA: Diagnosis not present

## 2024-03-11 ENCOUNTER — Ambulatory Visit
Admission: RE | Admit: 2024-03-11 | Discharge: 2024-03-11 | Disposition: A | Discharge status: Home / Self Care | Source: Ambulatory Visit | Attending: Obstetrics and Gynecology | Admitting: Obstetrics and Gynecology

## 2024-03-11 DIAGNOSIS — Z1231 Encounter for screening mammogram for malignant neoplasm of breast: Secondary | ICD-10-CM

## 2024-03-15 DIAGNOSIS — K219 Gastro-esophageal reflux disease without esophagitis: Secondary | ICD-10-CM | POA: Diagnosis not present

## 2024-03-15 DIAGNOSIS — Z1211 Encounter for screening for malignant neoplasm of colon: Secondary | ICD-10-CM | POA: Diagnosis not present

## 2024-03-15 DIAGNOSIS — D509 Iron deficiency anemia, unspecified: Secondary | ICD-10-CM | POA: Diagnosis not present

## 2024-04-06 DIAGNOSIS — M62838 Other muscle spasm: Secondary | ICD-10-CM | POA: Diagnosis not present

## 2024-04-06 DIAGNOSIS — M542 Cervicalgia: Secondary | ICD-10-CM | POA: Diagnosis not present

## 2024-04-06 DIAGNOSIS — M546 Pain in thoracic spine: Secondary | ICD-10-CM | POA: Diagnosis not present

## 2024-05-09 DIAGNOSIS — D259 Leiomyoma of uterus, unspecified: Secondary | ICD-10-CM | POA: Diagnosis not present
